# Patient Record
Sex: Male | Born: 1955 | Race: White | Hispanic: No | Marital: Married | Smoking: Former smoker
Health system: Southern US, Community
[De-identification: ages and names within clinical notes are randomized; demographics above are authoritative.]

## PROBLEM LIST (undated history)

## (undated) DIAGNOSIS — Z973 Presence of spectacles and contact lenses: Secondary | ICD-10-CM

## (undated) DIAGNOSIS — C801 Malignant (primary) neoplasm, unspecified: Secondary | ICD-10-CM

## (undated) DIAGNOSIS — I10 Essential (primary) hypertension: Secondary | ICD-10-CM

## (undated) DIAGNOSIS — M1990 Unspecified osteoarthritis, unspecified site: Secondary | ICD-10-CM

## (undated) DIAGNOSIS — E119 Type 2 diabetes mellitus without complications: Secondary | ICD-10-CM

## (undated) DIAGNOSIS — G4730 Sleep apnea, unspecified: Secondary | ICD-10-CM

## (undated) HISTORY — DX: Malignant (primary) neoplasm, unspecified: C80.1

## (undated) HISTORY — PX: VASECTOMY: SHX75

## (undated) HISTORY — PX: ELBOW SURGERY: SHX618

---

## 2008-02-08 ENCOUNTER — Emergency Department: Payer: Self-pay | Admitting: Emergency Medicine

## 2012-07-29 ENCOUNTER — Emergency Department: Payer: Self-pay | Admitting: Emergency Medicine

## 2012-08-08 ENCOUNTER — Emergency Department: Payer: Self-pay | Admitting: Emergency Medicine

## 2017-03-03 ENCOUNTER — Other Ambulatory Visit: Payer: Self-pay | Admitting: Sports Medicine

## 2017-03-03 DIAGNOSIS — M1712 Unilateral primary osteoarthritis, left knee: Secondary | ICD-10-CM

## 2017-05-02 ENCOUNTER — Encounter: Payer: Self-pay | Admitting: *Deleted

## 2017-05-02 ENCOUNTER — Other Ambulatory Visit: Payer: Self-pay

## 2017-05-09 ENCOUNTER — Encounter
Admission: RE | Disposition: A | Discharge status: Home / Self Care | Payer: Self-pay | Source: Ambulatory Visit | Attending: Orthopedic Surgery

## 2017-05-09 ENCOUNTER — Ambulatory Visit
Admission: RE | Admit: 2017-05-09 | Discharge: 2017-05-09 | Disposition: A | Discharge status: Home / Self Care | Payer: 59 | Source: Ambulatory Visit | Attending: Orthopedic Surgery | Admitting: Orthopedic Surgery

## 2017-05-09 ENCOUNTER — Ambulatory Visit: Payer: 59 | Admitting: Anesthesiology

## 2017-05-09 DIAGNOSIS — S83232A Complex tear of medial meniscus, current injury, left knee, initial encounter: Secondary | ICD-10-CM | POA: Insufficient documentation

## 2017-05-09 DIAGNOSIS — Z79899 Other long term (current) drug therapy: Secondary | ICD-10-CM | POA: Diagnosis not present

## 2017-05-09 DIAGNOSIS — Z7984 Long term (current) use of oral hypoglycemic drugs: Secondary | ICD-10-CM | POA: Diagnosis not present

## 2017-05-09 DIAGNOSIS — E785 Hyperlipidemia, unspecified: Secondary | ICD-10-CM | POA: Diagnosis not present

## 2017-05-09 DIAGNOSIS — E119 Type 2 diabetes mellitus without complications: Secondary | ICD-10-CM | POA: Insufficient documentation

## 2017-05-09 DIAGNOSIS — X58XXXA Exposure to other specified factors, initial encounter: Secondary | ICD-10-CM | POA: Diagnosis not present

## 2017-05-09 DIAGNOSIS — M25562 Pain in left knee: Secondary | ICD-10-CM | POA: Diagnosis present

## 2017-05-09 DIAGNOSIS — I10 Essential (primary) hypertension: Secondary | ICD-10-CM | POA: Insufficient documentation

## 2017-05-09 DIAGNOSIS — Z87891 Personal history of nicotine dependence: Secondary | ICD-10-CM | POA: Diagnosis not present

## 2017-05-09 DIAGNOSIS — Z7982 Long term (current) use of aspirin: Secondary | ICD-10-CM | POA: Diagnosis not present

## 2017-05-09 DIAGNOSIS — G4730 Sleep apnea, unspecified: Secondary | ICD-10-CM | POA: Insufficient documentation

## 2017-05-09 DIAGNOSIS — M25862 Other specified joint disorders, left knee: Secondary | ICD-10-CM | POA: Insufficient documentation

## 2017-05-09 HISTORY — PX: KNEE ARTHROSCOPY WITH MEDIAL MENISECTOMY: SHX5651

## 2017-05-09 HISTORY — DX: Unspecified osteoarthritis, unspecified site: M19.90

## 2017-05-09 HISTORY — DX: Sleep apnea, unspecified: G47.30

## 2017-05-09 HISTORY — DX: Presence of spectacles and contact lenses: Z97.3

## 2017-05-09 HISTORY — DX: Type 2 diabetes mellitus without complications: E11.9

## 2017-05-09 LAB — GLUCOSE, CAPILLARY
Glucose-Capillary: 182 mg/dL — ABNORMAL HIGH (ref 65–99)
Glucose-Capillary: 188 mg/dL — ABNORMAL HIGH (ref 65–99)

## 2017-05-09 SURGERY — ARTHROSCOPY, KNEE, WITH MEDIAL MENISCECTOMY
Anesthesia: General | Laterality: Left | Wound class: Clean

## 2017-05-09 MED ORDER — KETOROLAC TROMETHAMINE 30 MG/ML IJ SOLN
INTRAMUSCULAR | Status: DC | PRN
  Administered 2017-05-09: 30 mg via INTRAVENOUS

## 2017-05-09 MED ORDER — PROPOFOL 10 MG/ML IV BOLUS
INTRAVENOUS | Status: DC | PRN
  Administered 2017-05-09: 200 mg via INTRAVENOUS

## 2017-05-09 MED ORDER — LACTATED RINGERS IV SOLN: INTRAVENOUS | Status: DC

## 2017-05-09 MED ORDER — GLYCOPYRROLATE 0.2 MG/ML IJ SOLN
INTRAMUSCULAR | Status: DC | PRN
  Administered 2017-05-09: 0.1 mg via INTRAVENOUS

## 2017-05-09 MED ORDER — ASPIRIN EC 325 MG PO TBEC: 325.0000 mg | DELAYED_RELEASE_TABLET | Freq: Every day | ORAL | 0 refills | Status: AC

## 2017-05-09 MED ORDER — HYDROMORPHONE HCL 1 MG/ML IJ SOLN: 0.2500 mg | INTRAMUSCULAR | Status: DC | PRN

## 2017-05-09 MED ORDER — HYDROCODONE-ACETAMINOPHEN 5-325 MG PO TABS: 1.0000 | ORAL_TABLET | ORAL | 0 refills | Status: DC | PRN

## 2017-05-09 MED ORDER — OXYCODONE HCL 5 MG/5ML PO SOLN: 5.0000 mg | Freq: Once | ORAL | Status: DC | PRN

## 2017-05-09 MED ORDER — ONDANSETRON 4 MG PO TBDP: 4.0000 mg | ORAL_TABLET | Freq: Three times a day (TID) | ORAL | 0 refills | Status: DC | PRN

## 2017-05-09 MED ORDER — SODIUM CHLORIDE 0.9 % IV SOLN
900.0000 mg | Freq: Once | INTRAVENOUS | Status: AC
  Administered 2017-05-09: 900 mg via INTRAVENOUS

## 2017-05-09 MED ORDER — FENTANYL CITRATE (PF) 100 MCG/2ML IJ SOLN
INTRAMUSCULAR | Status: DC | PRN
  Administered 2017-05-09: 100 ug via INTRAVENOUS

## 2017-05-09 MED ORDER — OXYCODONE HCL 5 MG PO TABS: 5.0000 mg | ORAL_TABLET | Freq: Once | ORAL | Status: DC | PRN

## 2017-05-09 MED ORDER — LIDOCAINE HCL (CARDIAC) 20 MG/ML IV SOLN
INTRAVENOUS | Status: DC | PRN
  Administered 2017-05-09: 80 mg via INTRATRACHEAL

## 2017-05-09 MED ORDER — ONDANSETRON HCL 4 MG/2ML IJ SOLN
INTRAMUSCULAR | Status: DC | PRN
  Administered 2017-05-09: 4 mg via INTRAVENOUS

## 2017-05-09 MED ORDER — LIDOCAINE-EPINEPHRINE 1 %-1:100000 IJ SOLN
INTRAMUSCULAR | Status: DC | PRN
  Administered 2017-05-09: 30 mL

## 2017-05-09 MED ORDER — LACTATED RINGERS IV SOLN
INTRAVENOUS | Status: DC
  Administered 2017-05-09 (×2): via INTRAVENOUS

## 2017-05-09 MED ORDER — MIDAZOLAM HCL 5 MG/5ML IJ SOLN
INTRAMUSCULAR | Status: DC | PRN
  Administered 2017-05-09: 2 mg via INTRAVENOUS

## 2017-05-09 MED ORDER — IBUPROFEN 800 MG PO TABS: 800.0000 mg | ORAL_TABLET | Freq: Three times a day (TID) | ORAL | 0 refills | Status: AC

## 2017-05-09 MED ORDER — BUPIVACAINE HCL 0.5 % IJ SOLN
INTRAMUSCULAR | Status: DC | PRN
  Administered 2017-05-09: 30 mL

## 2017-05-09 SURGICAL SUPPLY — 43 items
ADAPTER IRRIG TUBE 2 SPIKE SOL (ADAPTER) ×6 IMPLANT
BLADE SURG 15 STRL LF DISP TIS (BLADE) ×1 IMPLANT
BLADE SURG 15 STRL SS (BLADE) ×2
BLADE SURG SZ11 CARB STEEL (BLADE) ×3 IMPLANT
BNDG COHESIVE 4X5 TAN STRL (GAUZE/BANDAGES/DRESSINGS) ×3 IMPLANT
BNDG ESMARK 6X12 TAN STRL LF (GAUZE/BANDAGES/DRESSINGS) IMPLANT
BRACE KNEE POST OP SHORT (BRACE) IMPLANT
BUR RADIUS 3.5 (BURR) ×3 IMPLANT
BUR RADIUS 4.0X18.5 (BURR) IMPLANT
CHLORAPREP W/TINT 26ML (MISCELLANEOUS) ×3 IMPLANT
CLEANER CAUTERY TIP 5X5 PAD (MISCELLANEOUS) ×1 IMPLANT
COOLER POLAR GLACIER W/PUMP (MISCELLANEOUS) ×3 IMPLANT
COVER LIGHT HANDLE UNIVERSAL (MISCELLANEOUS) ×6 IMPLANT
CUFF TOURN SGL QUICK 24 (TOURNIQUET CUFF) ×2
CUFF TOURN SGL QUICK 30 (MISCELLANEOUS)
CUFF TOURN SGL QUICK 34 (TOURNIQUET CUFF)
CUFF TRNQT CYL 24X4X40X1 (TOURNIQUET CUFF) ×1 IMPLANT
CUFF TRNQT CYL 34X4X40X1 (TOURNIQUET CUFF) IMPLANT
CUFF TRNQT CYL LO 30X4X (MISCELLANEOUS) IMPLANT
DRAPE IMP U-DRAPE 54X76 (DRAPES) ×3 IMPLANT
GAUZE SPONGE 4X4 12PLY STRL (GAUZE/BANDAGES/DRESSINGS) ×3 IMPLANT
GLOVE BIO SURGEON STRL SZ7.5 (GLOVE) ×3 IMPLANT
GLOVE BIOGEL PI IND STRL 8 (GLOVE) ×1 IMPLANT
GLOVE BIOGEL PI INDICATOR 8 (GLOVE) ×2
GOWN STRL REUS W/ TWL LRG LVL3 (GOWN DISPOSABLE) ×1 IMPLANT
GOWN STRL REUS W/TWL LRG LVL3 (GOWN DISPOSABLE) ×5 IMPLANT
IV LACTATED RINGER IRRG 3000ML (IV SOLUTION) ×8
IV LR IRRIG 3000ML ARTHROMATIC (IV SOLUTION) ×4 IMPLANT
KIT TURNOVER KIT A (KITS) ×3 IMPLANT
MAT BLUE FLOOR 46X72 FLO (MISCELLANEOUS) ×3 IMPLANT
NEPTUNE MANIFOLD (MISCELLANEOUS) ×3 IMPLANT
PACK ARTHROSCOPY KNEE (MISCELLANEOUS) ×3 IMPLANT
PAD CLEANER CAUTERY TIP 5X5 (MISCELLANEOUS) ×2
PAD WRAPON POLAR KNEE (MISCELLANEOUS) ×2 IMPLANT
PENCIL ELECTRO HAND CTR (MISCELLANEOUS) IMPLANT
SET TUBE SUCT SHAVER OUTFL 24K (TUBING) ×3 IMPLANT
SET TUBE TIP INTRA-ARTICULAR (MISCELLANEOUS) ×3 IMPLANT
SUT ETHILON 3-0 KS 30 BLK (SUTURE) ×3 IMPLANT
TOWEL OR 17X26 4PK STRL BLUE (TOWEL DISPOSABLE) ×6 IMPLANT
TUBING ARTHRO INFLOW-ONLY STRL (TUBING) ×3 IMPLANT
WAND HAND CNTRL MULTIVAC 50 (MISCELLANEOUS) IMPLANT
WAND HAND CNTRL MULTIVAC 90 (MISCELLANEOUS) ×3 IMPLANT
WRAPON POLAR PAD KNEE (MISCELLANEOUS) ×6

## 2017-05-09 NOTE — Anesthesia Preprocedure Evaluation (Signed)
Anesthesia Evaluation   Patient awake    Reviewed: Allergy & Precautions, H&P , NPO status , Patient's Chart, lab work & pertinent test results  History of Anesthesia Complications Negative for: history of anesthetic complications  Airway Mallampati: I  TM Distance: >3 FB Neck ROM: full    Dental no notable dental hx.    Pulmonary sleep apnea , former smoker,    Pulmonary exam normal        Cardiovascular Normal cardiovascular exam     Neuro/Psych    GI/Hepatic negative GI ROS, Neg liver ROS,   Endo/Other  diabetes, Well Controlled, Type 2  Renal/GU negative Renal ROS     Musculoskeletal   Abdominal   Peds  Hematology negative hematology ROS (+)   Anesthesia Other Findings   Reproductive/Obstetrics                             Anesthesia Physical Anesthesia Plan  ASA: II  Anesthesia Plan: General LMA   Post-op Pain Management:    Induction:   PONV Risk Score and Plan:   Airway Management Planned:   Additional Equipment:   Intra-op Plan:   Post-operative Plan:   Informed Consent: I have reviewed the patients History and Physical, chart, labs and discussed the procedure including the risks, benefits and alternatives for the proposed anesthesia with the patient or authorized representative who has indicated his/her understanding and acceptance.     Plan Discussed with:   Anesthesia Plan Comments:         Anesthesia Quick Evaluation

## 2017-05-09 NOTE — Transfer of Care (Signed)
Immediate Anesthesia Transfer of Care Note  Patient: Carlos Hammond  Procedure(s) Performed: KNEE ARTHROSCOPY WITH PARTIAL  MEDIAL MENISECTOMY CHONDROPLASTY (Left )  Patient Location: PACU  Anesthesia Type: General LMA  Level of Consciousness: awake, alert  and patient cooperative  Airway and Oxygen Therapy: Patient Spontanous Breathing and Patient connected to supplemental oxygen  Post-op Assessment: Post-op Vital signs reviewed, Patient's Cardiovascular Status Stable, Respiratory Function Stable, Patent Airway and No signs of Nausea or vomiting  Post-op Vital Signs: Reviewed and stable  Complications: No apparent anesthesia complications

## 2017-05-09 NOTE — Op Note (Signed)
Operative Note   SURGERY DATE: 05/09/2017  PRE-OP DIAGNOSIS:  1. Left medial meniscus tear 2. Left trochlea chondral defect  POST-OP DIAGNOSIS: 1. 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 Left knee arthroscopy, partial medial meniscectomy 2. Left chondroplasty of patellofemoral compartment  SURGEON: Cato Mulligan, MD  ANESTHESIA: Gen  ESTIMATED BLOOD LOSS:minimal  TOTAL IV FLUIDS: per anesthesia  INDICATION(S):  Carlos Hammond is a 62 y.o. male with signs and symptoms as well as MRI finding of medial meniscus tear. Chondral defect of the trochlea was also noted, but he did not appear to be symptomatic from this lesion. After discussion of risks, benefits, and alternatives to surgery, the patient elected to proceed.  OPERATIVE FINDINGS:   Examination under anesthesia:A careful examination under anesthesia was performed. Passive range of motion was: Hyperextension: 2. Extension: 0. Flexion: 140. Lachman: normal. Pivot Shift: normal. Posterior drawer: normal. Varus stability in full extension: normal. Varus stability in 30 degrees of flexion: normal. Valgus stability in full extension: normal. Valgus stability in 30 degrees of flexion: normal.  Intra-operative findings:A thorough arthroscopic examination of the knee was performed. The findings are: 1. Suprapatellar pouch: Normal 2. Undersurface of median ridge: Normal 3. Medial patellar facet: Grade 1 degenerative changes 4. Lateral patellar facet: Grade 2 degenerative changes 5. Trochlea: Grade 1 degenerative changes diffusely, with focal area of Grade 4 changes on lateral trochlea measuring ~76mm x 29mm 6. Lateral gutter/popliteus tendon: Normal 7. Hoffa's fat pad: Normal 8. Medial gutter/plica: Normal 9. ACL: Normal 10. PCL: Normal 11. Medial meniscus: Complex tear with primarily radial tear, but also parrot beak component of  tear 12. Medial compartment cartilage: Grade 1 degenerative changes 13. Lateral meniscus: Normal 14. Lateral compartment cartilage: Grade 1 degenerative changes  OPERATIVE REPORT:   I identified Carlos Hammond the pre-operative holding area. I marked theoperativeknee with my initials. I reviewed the risks and benefits of the proposed surgical intervention and the patient (and/or patient's guardian) wished to proceed. The patient was transferred to the operative suite and placed in the supine position with all bony prominences padded. Anesthesia was administered. Appropriate IV antibioticswere administered prior to incision. The extremity was then prepped and draped in standard fashion. A time out was performed confirming the correct extremity, correct patient, and correct procedure.  Arthroscopy portals were marked. Local anesthetic was injected to the planned portal sites. The anterolateral portalwasestablished with an 11blade.   The arthroscope was placed in the anterolateral portal and theninto the suprapatellar pouch. A diagnostic knee scope was completed with the above findings. The medial meniscus tear was identified.  Next the medial portal was established under needle localization. The MCL was pie-crusted to improve visualization of the posterior horn. The meniscal tear was debrided using an arthroscopic biter and an oscillating shaver until the meniscus had stable borders. A chondroplasty was performed of the patellofemoral compartment (lateral patellar facet and trochlear lesion) such that there were stable cartilage edges without any loose fragments of cartilage.  Arthroscopic fluid was removed from the joint.  The portals were closed with 3-0 Nylon suture. Sterile dressings included Xeroform, 4x4s, Sof-Rol, and Bias wrap. A Polarcare was placed.  The patient was then awakened and taken to the PACU hemodynamically stable without complication.   POSTOPERATIVE  PLAN: The patient will be discharged home today once they meet PACU criteria. Aspirin 325 mg daily was prescribed for 2 weeks for DVT prophylaxis. Physical therapy will start  on POD#3-4.Weight-bearing as tolerated. They will follow up in 2 weeks per protocol.

## 2017-05-09 NOTE — OR Nursing (Signed)
Patient was shaved by electric razor for anes. 3 lead placement. Per Jetty Peeks CRNA

## 2017-05-09 NOTE — Anesthesia Procedure Notes (Signed)
Procedure Name: LMA Insertion Performed by: Lind Guest, CRNA Pre-anesthesia Checklist: Patient identified, Patient being monitored, Timeout performed, Emergency Drugs available and Suction available Patient Re-evaluated:Patient Re-evaluated prior to induction Oxygen Delivery Method: Circle system utilized Preoxygenation: Pre-oxygenation with 100% oxygen Induction Type: IV induction Ventilation: Mask ventilation without difficulty LMA: LMA inserted LMA Size: 4.0 Tube type: Oral Number of attempts: 1 Placement Confirmation: positive ETCO2 and breath sounds checked- equal and bilateral Tube secured with: Tape Dental Injury: Teeth and Oropharynx as per pre-operative assessment

## 2017-05-09 NOTE — Discharge Instructions (Signed)
Arthroscopic Knee Surgery - Partial Meniscectomy  Post-Op Instructions  1. Bracing or crutches: Crutches will be provided at the time of discharge from the surgery center if you do not already have them.  2. Ice: You may be provided with a device [facility]) that allows you to ice the affected area effectively. Otherwise you can ice manually.   3. Driving:  Plan on not driving for at least two weeks. Please note that you are advised NOT to drive while taking narcotic pain medications as you may be impaired and unsafe to drive.  4. Activity: Ankle pumps several times an hour while awake to prevent blood clots. Weight bearing: as tolerated. Use crutches for as needed (usually ~1 week or less) until pain allows you to ambulate without a limp. Bending and straightening the knee is unlimited. Elevate knee above heart level as much as possible for one week. Avoid standing more than 5 minutes (consecutively) for the first week.  Avoid long distance travel for 2 weeks.  5. Medications:  - You have been provided a prescription for narcotic pain medicine. After surgery, take 1-2 narcotic tablets every 4 hours if needed for severe pain.  - A prescription for anti-nausea medication will be provided in case the narcotic medicine causes nausea - take 1 tablet every 6 hours only if nauseated.  - Take ibuprofen 800 mg every 8 hours WITH food to reduce post-operative knee swelling. DO NOT STOP IBUPROFEN POST-OP UNTIL INSTRUCTED TO DO SO at first post-op office visit (10-14 days after surgery). However, please discontinue if you have any abdominal discomfort after taking this.  - Take enteric coated aspirin 325 mg once daily for 2 weeks to prevent blood clots.    6. Bandages: The physical therapist should change the bandages at the first post-op appointment. If needed, the dressing supplies have been provided to you.  7. Physical Therapy: 1-2 times per week for 6 weeks. Therapy typically starts on post  operative Day 3 or 4. You have been provided an order for physical therapy. The therapist will provide home exercises.  8. Work: May return to full work usually around 2 weeks after 1st post-operative visit. May do light duty/desk job in approximately 1 week when off of narcotics, pain is well-controlled, and swelling has decreased. Labor intensive jobs may require 4-6 weeks to return.    9. Post-Op Appointments: Your first post-op appointment will be with Dr. Posey Pronto in approximately 2 weeks time.   If you find that they have not been scheduled please call the Orthopaedic Appointment front desk at [phone].      General Anesthesia, Adult, Care After These instructions provide you with information about caring for yourself after your procedure. Your health care provider may also give you more specific instructions. Your treatment has been planned according to current medical practices, but problems sometimes occur. Call your health care provider if you have any problems or questions after your procedure. What can I expect after the procedure? After the procedure, it is common to have:  Vomiting.  A sore throat.  Mental slowness.  It is common to feel:  Nauseous.  Cold or shivery.  Sleepy.  Tired.  Sore or achy, even in parts of your body where you did not have surgery.  Follow these instructions at home: For at least 24 hours after the procedure:  Do not: ? Participate in activities where you could fall or become injured. ? Drive. ? Use heavy machinery. ? Drink alcohol. ? Take  sleeping pills or medicines that cause drowsiness. ? Make important decisions or sign legal documents. ? Take care of children on your own.  Rest. Eating and drinking  If you vomit, drink water, juice, or soup when you can drink without vomiting.  Drink enough fluid to keep your urine clear or pale yellow.  Make sure you have little or no nausea before eating solid foods.  Follow  the diet recommended by your health care provider. General instructions  Have a responsible adult stay with you until you are awake and alert.  Return to your normal activities as told by your health care provider. Ask your health care provider what activities are safe for you.  Take over-the-counter and prescription medicines only as told by your health care provider.  If you smoke, do not smoke without supervision.  Keep all follow-up visits as told by your health care provider. This is important. Contact a health care provider if:  You continue to have nausea or vomiting at home, and medicines are not helpful.  You cannot drink fluids or start eating again.  You cannot urinate after 8-12 hours.  You develop a skin rash.  You have fever.  You have increasing redness at the site of your procedure. Get help right away if:  You have difficulty breathing.  You have chest pain.  You have unexpected bleeding.  You feel that you are having a life-threatening or urgent problem. This information is not intended to replace advice given to you by your health care provider. Make sure you discuss any questions you have with your health care provider. Document Released: 05/30/2000 Document Revised: 07/27/2015 Document Reviewed: 02/05/2015 Elsevier Interactive Patient Education  [employer].

## 2017-05-09 NOTE — H&P (Signed)
Paper H&P to be scanned into permanent record. H&P reviewed. No significant changes noted.  

## 2017-05-09 NOTE — Anesthesia Postprocedure Evaluation (Signed)
Anesthesia Post Note  Patient: Carlos Hammond  Procedure(s) Performed: KNEE ARTHROSCOPY WITH PARTIAL  MEDIAL MENISECTOMY CHONDROPLASTY (Left )  Patient location during evaluation: PACU Anesthesia Type: General Level of consciousness: awake and alert Pain management: pain level controlled Vital Signs Assessment: post-procedure vital signs reviewed and stable Respiratory status: spontaneous breathing Cardiovascular status: blood pressure returned to baseline Postop Assessment: no headache Anesthetic complications: no    Jaci Standard, III,  Zanyia Silbaugh D

## 2017-05-10 ENCOUNTER — Encounter: Payer: Self-pay | Admitting: Orthopedic Surgery

## 2019-01-07 ENCOUNTER — Other Ambulatory Visit (HOSPITAL_COMMUNITY): Payer: Self-pay | Admitting: Orthopedic Surgery

## 2019-01-07 ENCOUNTER — Other Ambulatory Visit: Payer: Self-pay | Admitting: Orthopedic Surgery

## 2019-01-07 DIAGNOSIS — M7542 Impingement syndrome of left shoulder: Secondary | ICD-10-CM

## 2019-01-14 ENCOUNTER — Ambulatory Visit
Admission: RE | Admit: 2019-01-14 | Discharge: 2019-01-14 | Disposition: A | Discharge status: Home / Self Care | Payer: 59 | Source: Ambulatory Visit | Attending: Orthopedic Surgery | Admitting: Orthopedic Surgery

## 2019-01-14 DIAGNOSIS — M7542 Impingement syndrome of left shoulder: Secondary | ICD-10-CM | POA: Diagnosis not present

## 2019-01-23 ENCOUNTER — Other Ambulatory Visit: Payer: Self-pay | Admitting: Orthopedic Surgery

## 2019-01-23 DIAGNOSIS — M7542 Impingement syndrome of left shoulder: Secondary | ICD-10-CM

## 2019-01-25 ENCOUNTER — Other Ambulatory Visit: Payer: Self-pay | Admitting: Orthopedic Surgery

## 2019-02-07 ENCOUNTER — Encounter: Payer: Self-pay | Admitting: *Deleted

## 2019-02-07 ENCOUNTER — Other Ambulatory Visit: Payer: Self-pay

## 2019-02-12 ENCOUNTER — Other Ambulatory Visit: Admission: RE | Admit: 2019-02-12 | Payer: 59 | Source: Ambulatory Visit

## 2019-02-13 ENCOUNTER — Other Ambulatory Visit
Admission: RE | Admit: 2019-02-13 | Discharge: 2019-02-13 | Disposition: A | Discharge status: Home / Self Care | Payer: 59 | Source: Ambulatory Visit | Attending: Rheumatology | Admitting: Rheumatology

## 2019-02-13 ENCOUNTER — Other Ambulatory Visit: Payer: Self-pay

## 2019-02-13 DIAGNOSIS — Z20828 Contact with and (suspected) exposure to other viral communicable diseases: Secondary | ICD-10-CM | POA: Diagnosis not present

## 2019-02-13 DIAGNOSIS — Z01812 Encounter for preprocedural laboratory examination: Secondary | ICD-10-CM | POA: Insufficient documentation

## 2019-02-13 LAB — SARS CORONAVIRUS 2 (TAT 6-24 HRS): SARS Coronavirus 2: NEGATIVE

## 2019-02-15 ENCOUNTER — Encounter
Admission: RE | Disposition: A | Discharge status: Home / Self Care | Payer: Self-pay | Source: Home / Self Care | Attending: Orthopedic Surgery

## 2019-02-15 ENCOUNTER — Encounter: Payer: Self-pay | Admitting: Orthopedic Surgery

## 2019-02-15 ENCOUNTER — Other Ambulatory Visit: Payer: Self-pay

## 2019-02-15 ENCOUNTER — Ambulatory Visit: Payer: No Typology Code available for payment source | Admitting: Anesthesiology

## 2019-02-15 ENCOUNTER — Ambulatory Visit
Admission: RE | Admit: 2019-02-15 | Discharge: 2019-02-15 | Disposition: A | Discharge status: Home / Self Care | Payer: No Typology Code available for payment source | Attending: Orthopedic Surgery | Admitting: Orthopedic Surgery

## 2019-02-15 DIAGNOSIS — M75112 Incomplete rotator cuff tear or rupture of left shoulder, not specified as traumatic: Secondary | ICD-10-CM | POA: Diagnosis not present

## 2019-02-15 DIAGNOSIS — I10 Essential (primary) hypertension: Secondary | ICD-10-CM | POA: Diagnosis not present

## 2019-02-15 DIAGNOSIS — E785 Hyperlipidemia, unspecified: Secondary | ICD-10-CM | POA: Insufficient documentation

## 2019-02-15 DIAGNOSIS — Z79899 Other long term (current) drug therapy: Secondary | ICD-10-CM | POA: Insufficient documentation

## 2019-02-15 DIAGNOSIS — G4730 Sleep apnea, unspecified: Secondary | ICD-10-CM | POA: Diagnosis not present

## 2019-02-15 DIAGNOSIS — Z8249 Family history of ischemic heart disease and other diseases of the circulatory system: Secondary | ICD-10-CM | POA: Diagnosis not present

## 2019-02-15 DIAGNOSIS — Z7984 Long term (current) use of oral hypoglycemic drugs: Secondary | ICD-10-CM | POA: Diagnosis not present

## 2019-02-15 DIAGNOSIS — Z820 Family history of epilepsy and other diseases of the nervous system: Secondary | ICD-10-CM | POA: Diagnosis not present

## 2019-02-15 DIAGNOSIS — M7542 Impingement syndrome of left shoulder: Secondary | ICD-10-CM | POA: Diagnosis not present

## 2019-02-15 DIAGNOSIS — Z880 Allergy status to penicillin: Secondary | ICD-10-CM | POA: Diagnosis not present

## 2019-02-15 DIAGNOSIS — Z83511 Family history of glaucoma: Secondary | ICD-10-CM | POA: Diagnosis not present

## 2019-02-15 DIAGNOSIS — Z833 Family history of diabetes mellitus: Secondary | ICD-10-CM | POA: Insufficient documentation

## 2019-02-15 DIAGNOSIS — E119 Type 2 diabetes mellitus without complications: Secondary | ICD-10-CM | POA: Diagnosis not present

## 2019-02-15 DIAGNOSIS — M7522 Bicipital tendinitis, left shoulder: Secondary | ICD-10-CM | POA: Diagnosis not present

## 2019-02-15 DIAGNOSIS — Z87891 Personal history of nicotine dependence: Secondary | ICD-10-CM | POA: Diagnosis not present

## 2019-02-15 DIAGNOSIS — Z7982 Long term (current) use of aspirin: Secondary | ICD-10-CM | POA: Diagnosis not present

## 2019-02-15 HISTORY — PX: SHOULDER ARTHROSCOPY WITH SUBACROMIAL DECOMPRESSION AND BICEP TENDON REPAIR: SHX5689

## 2019-02-15 LAB — GLUCOSE, CAPILLARY
Glucose-Capillary: 169 mg/dL — ABNORMAL HIGH (ref 70–99)
Glucose-Capillary: 144 mg/dL — ABNORMAL HIGH (ref 70–99)

## 2019-02-15 SURGERY — SHOULDER ARTHROSCOPY WITH SUBACROMIAL DECOMPRESSION AND BICEP TENDON REPAIR
Anesthesia: General | Site: Shoulder | Laterality: Left

## 2019-02-15 MED ORDER — DEXAMETHASONE SODIUM PHOSPHATE 4 MG/ML IJ SOLN
INTRAMUSCULAR | Status: DC | PRN
  Administered 2019-02-15: 4 mg via INTRAVENOUS

## 2019-02-15 MED ORDER — ACETAMINOPHEN 160 MG/5ML PO SOLN: 325.0000 mg | ORAL | Status: DC | PRN

## 2019-02-15 MED ORDER — BUPIVACAINE HCL (PF) 0.5 % IJ SOLN
INTRAMUSCULAR | Status: DC | PRN
  Administered 2019-02-15: 20 mL via PERINEURAL

## 2019-02-15 MED ORDER — LACTATED RINGERS IV SOLN
INTRAVENOUS | Status: DC | PRN
  Administered 2019-02-15: 4 mL

## 2019-02-15 MED ORDER — ONDANSETRON HCL 4 MG/2ML IJ SOLN
INTRAMUSCULAR | Status: DC | PRN
  Administered 2019-02-15: 4 mg via INTRAVENOUS

## 2019-02-15 MED ORDER — ACETAMINOPHEN 325 MG PO TABS: 325.0000 mg | ORAL_TABLET | ORAL | Status: DC | PRN

## 2019-02-15 MED ORDER — FENTANYL CITRATE (PF) 100 MCG/2ML IJ SOLN
INTRAMUSCULAR | Status: DC | PRN
  Administered 2019-02-15: 50 ug via INTRAVENOUS

## 2019-02-15 MED ORDER — CLINDAMYCIN PHOSPHATE 900 MG/50ML IV SOLN
900.0000 mg | INTRAVENOUS | Status: AC
  Administered 2019-02-15: 900 mg via INTRAVENOUS

## 2019-02-15 MED ORDER — LIDOCAINE HCL (CARDIAC) PF 100 MG/5ML IV SOSY
PREFILLED_SYRINGE | INTRAVENOUS | Status: DC | PRN
  Administered 2019-02-15: 30 mg via INTRATRACHEAL

## 2019-02-15 MED ORDER — OXYCODONE HCL 5 MG/5ML PO SOLN: 5.0000 mg | Freq: Once | ORAL | Status: DC | PRN

## 2019-02-15 MED ORDER — OXYCODONE HCL 5 MG PO TABS: 5.0000 mg | ORAL_TABLET | Freq: Once | ORAL | Status: DC | PRN

## 2019-02-15 MED ORDER — ASPIRIN EC 325 MG PO TBEC: 325.0000 mg | DELAYED_RELEASE_TABLET | Freq: Every day | ORAL | 0 refills | Status: AC

## 2019-02-15 MED ORDER — ONDANSETRON HCL 4 MG/2ML IJ SOLN: 4.0000 mg | Freq: Once | INTRAMUSCULAR | Status: DC | PRN

## 2019-02-15 MED ORDER — ACETAMINOPHEN 500 MG PO TABS: 1000.0000 mg | ORAL_TABLET | Freq: Three times a day (TID) | ORAL | 2 refills | Status: AC

## 2019-02-15 MED ORDER — FENTANYL CITRATE (PF) 100 MCG/2ML IJ SOLN: 25.0000 ug | INTRAMUSCULAR | Status: DC | PRN

## 2019-02-15 MED ORDER — MIDAZOLAM HCL 5 MG/5ML IJ SOLN
INTRAMUSCULAR | Status: DC | PRN
  Administered 2019-02-15: 2 mg via INTRAVENOUS

## 2019-02-15 MED ORDER — GLYCOPYRROLATE 0.2 MG/ML IJ SOLN
INTRAMUSCULAR | Status: DC | PRN
  Administered 2019-02-15: .1 mg via INTRAVENOUS

## 2019-02-15 MED ORDER — LACTATED RINGERS IV SOLN
100.0000 mL/h | INTRAVENOUS | Status: DC
  Administered 2019-02-15: 11:00:00 via INTRAVENOUS

## 2019-02-15 MED ORDER — BUPIVACAINE LIPOSOME 1.3 % IJ SUSP
INTRAMUSCULAR | Status: DC | PRN
  Administered 2019-02-15: 20 mL via PERINEURAL

## 2019-02-15 MED ORDER — PROPOFOL 10 MG/ML IV BOLUS
INTRAVENOUS | Status: DC | PRN
  Administered 2019-02-15: 180 mg via INTRAVENOUS

## 2019-02-15 MED ORDER — CHLORHEXIDINE GLUCONATE 4 % EX LIQD: 60.0000 mL | Freq: Once | CUTANEOUS | Status: DC

## 2019-02-15 MED ORDER — OXYCODONE HCL 5 MG PO TABS: 5.0000 mg | ORAL_TABLET | ORAL | 0 refills | Status: AC | PRN

## 2019-02-15 MED ORDER — ONDANSETRON 4 MG PO TBDP: 4.0000 mg | ORAL_TABLET | Freq: Three times a day (TID) | ORAL | 0 refills | Status: DC | PRN

## 2019-02-15 SURGICAL SUPPLY — 74 items
ADAPTER IRRIG TUBE 2 SPIKE SOL (ADAPTER) ×6 IMPLANT
ANCHOR 3.9 PEEK 3 CORKSCREW (Anchor) ×3 IMPLANT
ANCHOR BONE REGENETEN (Anchor) ×3 IMPLANT
ANCHOR SUT FBRTK SUTURETAP 1.3 (Anchor) ×3 IMPLANT
ANCHOR TENDON REGENETEN (Staple) ×3 IMPLANT
BIT DRILL RIGD1.8MM FBRTK STRL (DRILL) ×1 IMPLANT
BLADE CLIPPER SPEC (BLADE) ×3 IMPLANT
BUR BR 5.5 12 FLUTE (BURR) ×3 IMPLANT
BUR RADIUS 4.0X18.5 (BURR) ×3 IMPLANT
CANNULA 5.75X7CM (CANNULA) ×1
CANNULA PART THRD DISP 5.75X7 (CANNULA) ×2 IMPLANT
CANNULA PARTIAL THREAD 2X7 (CANNULA) ×3 IMPLANT
CHLORAPREP W/TINT 26 (MISCELLANEOUS) ×3 IMPLANT
COOLER POLAR GLACIER W/PUMP (MISCELLANEOUS) ×3 IMPLANT
COVER LIGHT HANDLE UNIVERSAL (MISCELLANEOUS) ×6 IMPLANT
DERMABOND ADVANCED (GAUZE/BANDAGES/DRESSINGS) ×2
DERMABOND ADVANCED .7 DNX12 (GAUZE/BANDAGES/DRESSINGS) ×1 IMPLANT
DRAPE IMP U-DRAPE 54X76 (DRAPES) ×3 IMPLANT
DRAPE INCISE IOBAN 66X45 STRL (DRAPES) ×3 IMPLANT
DRAPE SHEET LG 3/4 BI-LAMINATE (DRAPES) ×3 IMPLANT
DRAPE U-SHAPE 48X52 POLY STRL (PACKS) ×6 IMPLANT
DRILL RIGID 1.8MM FBRTK STRL (DRILL) ×3
DRSG TEGADERM 4X4.75 (GAUZE/BANDAGES/DRESSINGS) ×9 IMPLANT
ELECT REM PT RETURN 9FT ADLT (ELECTROSURGICAL) ×3
ELECTRODE REM PT RTRN 9FT ADLT (ELECTROSURGICAL) ×1 IMPLANT
FEE LOANER SHOULDER (INSTRUMENTS) ×1 IMPLANT
GAUZE XEROFORM 1X8 LF (GAUZE/BANDAGES/DRESSINGS) IMPLANT
GLOVE BIO SURGEON STRL SZ7.5 (GLOVE) ×6 IMPLANT
GLOVE BIOGEL PI IND STRL 8 (GLOVE) ×2 IMPLANT
GLOVE BIOGEL PI INDICATOR 8 (GLOVE) ×4
GOWN STRL REIN 2XL XLG LVL4 (GOWN DISPOSABLE) ×3 IMPLANT
GOWN STRL REUS W/ TWL LRG LVL3 (GOWN DISPOSABLE) ×2 IMPLANT
GOWN STRL REUS W/TWL LRG LVL3 (GOWN DISPOSABLE) ×4
IMPL REGENETEN MEDIUM (Shoulder) ×1 IMPLANT
IMPLANT REGENETEN MEDIUM (Shoulder) ×3 IMPLANT
IV LACTATED RINGER IRRG 3000ML (IV SOLUTION) ×16
IV LR IRRIG 3000ML ARTHROMATIC (IV SOLUTION) ×8 IMPLANT
KIT CORKSCREW KNTLS 3.9 S/T/P (INSTRUMENTS) ×3 IMPLANT
KIT SPEAR STR 1.6MM DRILL (MISCELLANEOUS) ×3 IMPLANT
KIT STABILIZATION SHOULDER (MISCELLANEOUS) ×3 IMPLANT
KIT TURNOVER KIT A (KITS) ×3 IMPLANT
MANIFOLD NEPTUNE II (INSTRUMENTS) ×3 IMPLANT
MASK FACE SPIDER DISP (MASK) ×3 IMPLANT
MAT GRAY ABSORB FLUID 28X50 (MISCELLANEOUS) ×9 IMPLANT
NDL MAYO CATGUT SZ5 (NEEDLE) ×2
NDL SAFETY ECLIPSE 18X1.5 (NEEDLE) IMPLANT
NDL SUT 5 .5 CRC TPR PNT MAYO (NEEDLE) ×1 IMPLANT
NEEDLE HYPO 18GX1.5 SHARP (NEEDLE)
NEEDLE SCORPION MULTI FIRE (NEEDLE) ×3 IMPLANT
PACK ARTHROSCOPY SHOULDER (MISCELLANEOUS) ×3 IMPLANT
PAD WRAPON POLAR SHDR UNIV (MISCELLANEOUS) ×1 IMPLANT
PENCIL SMOKE EVACUATOR (MISCELLANEOUS) ×3 IMPLANT
SET TUBE SUCT SHAVER OUTFL 24K (TUBING) ×3 IMPLANT
SET TUBE TIP INTRA-ARTICULAR (MISCELLANEOUS) ×3 IMPLANT
SHOULDER LOANER FEE (INSTRUMENTS) ×3
SPONGE GAUZE 2X2 8PLY STER LF (GAUZE/BANDAGES/DRESSINGS) ×3
SPONGE GAUZE 2X2 8PLY STRL LF (GAUZE/BANDAGES/DRESSINGS) ×6 IMPLANT
SPONGE LAP 18X18 RF (DISPOSABLE) ×3 IMPLANT
SUT ETHILON 3-0 FS-10 30 BLK (SUTURE) ×3
SUT MNCRL 4-0 (SUTURE) ×2
SUT MNCRL 4-0 27XMFL (SUTURE) ×1
SUT PROLENE 2 0 CT2 30 (SUTURE) ×3 IMPLANT
SUT VIC AB 0 CT1 36 (SUTURE) ×3 IMPLANT
SUT VIC AB 2-0 CT1 27 (SUTURE) ×2
SUT VIC AB 2-0 CT1 TAPERPNT 27 (SUTURE) ×1 IMPLANT
SUTURE EHLN 3-0 FS-10 30 BLK (SUTURE) ×1 IMPLANT
SUTURE MNCRL 4-0 27XMF (SUTURE) ×1 IMPLANT
SUTURE TAPE 1.3 40 TPR END (SUTURE) ×2 IMPLANT
SUTURETAPE 1.3 40 TPR END (SUTURE) ×6
SYR 10ML LL (SYRINGE) IMPLANT
TAPE MICROFOAM 4IN (TAPE) ×3 IMPLANT
TUBING ARTHRO INFLOW-ONLY STRL (TUBING) ×3 IMPLANT
WAND WEREWOLF FLOW 90D (MISCELLANEOUS) ×3 IMPLANT
WRAPON POLAR PAD SHDR UNIV (MISCELLANEOUS) ×3

## 2019-02-15 NOTE — Discharge Instructions (Signed)
Post-Op Instructions - Rotator Cuff Repair  1. Bracing: You will wear a shoulder immobilizer or sling for 4 weeks.   2. Driving: No driving for 2 weeks post-op. When driving, do not wear the immobilizer. Ideally, we recommend no driving for 4 weeks while sling is in place as one arm will be immobilized.   3. Activity: No active lifting for 6 weeks. Wrist, hand, and elbow motion only. Avoid lifting the upper arm away from the body except for hygiene. You are permitted to bend and straighten the elbow passively only (no active elbow motion). You may use your hand and wrist for typing, writing, and managing utensils (cutting food). Do not lift more than a coffee cup for 8 weeks.  When sleeping or resting, inclined positions (recliner chair or wedge pillow) and a pillow under the forearm for support may provide better comfort for up to 4 weeks.  Avoid long distance travel for 4 weeks.  Return to normal activities after rotator cuff repair repair normally takes 6 months on average. If rehab goes very well, may be able to do most activities at 4 months, except overhead or contact sports.  4. Physical Therapy: Begins 3-4 days after surgery, and proceed 1 time per week for the first 6 weeks, then 1-2 times per week from weeks 6-20 post-op.  5. Medications:  - You will be provided a prescription for narcotic pain medicine. After surgery, take 1-2 narcotic tablets every 4 hours if needed for severe pain.  - A prescription for anti-nausea medication will be provided in case the narcotic medicine causes nausea - take 1 tablet every 6 hours only if nauseated.   - Take tylenol 1000 mg (2 Extra Strength tablets or 3 regular strength) every 8 hours for pain.  May decrease or stop tylenol 5 days after surgery if you are having minimal pain. - Take ASA 325mg /day x 2 weeks to help prevent DVTs/PEs (blood clots).  - DO NOT take ANY nonsteroidal anti-inflammatory pain medications (Advil, Motrin, Ibuprofen, Aleve,  Naproxen, or Naprosyn). These medicines can inhibit healing of your shoulder repair.    If you are taking prescription medication for anxiety, depression, insomnia, muscle spasm, chronic pain, or for attention deficit disorder, you are advised that you are at a higher risk of adverse effects with use of narcotics post-op, including narcotic addiction/dependence, depressed breathing, death. If you use non-prescribed substances: alcohol, marijuana, cocaine, heroin, methamphetamines, etc., you are at a higher risk of adverse effects with use of narcotics post-op, including narcotic addiction/dependence, depressed breathing, death. You are advised that taking > 50 morphine milligram equivalents (MME) of narcotic pain medication per day results in twice the risk of overdose or death. For your prescription provided: oxycodone 5 mg - taking more than 6 tablets per day would result in > 50 morphine milligram equivalents (MME) of narcotic pain medication. Be advised that we will prescribe narcotics short-term, for acute post-operative pain only - 3 weeks for major operations such as shoulder repair/reconstruction surgeries.     6. Post-Op Appointment:  Your first post-op appointment will be 10-14 days post-op.  7. Work or School: For most, but not all procedures, we advise staying out of work or school for at least 1 to 2 weeks in order to recover from the stress of surgery and to allow time for healing.   If you need a work or school note this can be provided.   8. Smoking: If you are a smoker, you need to refrain from  smoking in the postoperative period. The nicotine in cigarettes will inhibit healing of your shoulder repair and decrease the chance of successful repair. Similarly, nicotine containing products (gum, patches) should be avoided.   Post-operative Brace: Apply and remove the brace you received as you were instructed to at the time of fitting and as described in detail as the braces  instructions for use indicate.  Wear the brace for the period of time prescribed by your physician.  The brace can be cleaned with soap and water and allowed to air dry only.  Should the brace result in increased pain, decreased feeling (numbness/tingling), increased swelling or an overall worsening of your medical condition, please contact your doctor immediately.  If an emergency situation occurs as a result of wearing the brace after normal business hours, please dial 911 and seek immediate medical attention.  Let your doctor know if you have any further questions about the brace issued to you. Refer to the shoulder sling instructions for use if you have any questions regarding the correct fit of your shoulder sling.  [employer]ooting: [phone]  Video that illustrates how to properly use a shoulder sling: "Instructions for Proper Use of an Orthopaedic Sling" [email or web address]       General Anesthesia, Adult, Care After This sheet gives you information about how to care for yourself after your procedure. Your health care provider may also give you more specific instructions. If you have problems or questions, contact your health care provider. What can I expect after the procedure? After the procedure, the following side effects are common:  Pain or discomfort at the IV site.  Nausea.  Vomiting.  Sore throat.  Trouble concentrating.  Feeling cold or chills.  Weak or tired.  Sleepiness and fatigue.  Soreness and body aches. These side effects can affect parts of the body that were not involved in surgery. Follow these instructions at home:  For at least 24 hours after the procedure:  Have a responsible adult stay with you. It is important to have someone help care for you until you are awake and alert.  Rest as needed.  Do not: ? Participate in activities in which you could fall or become injured. ? Drive. ? Use heavy  machinery. ? Drink alcohol. ? Take sleeping pills or medicines that cause drowsiness. ? Make important decisions or sign legal documents. ? Take care of children on your own. Eating and drinking  Follow any instructions from your health care provider about eating or drinking restrictions.  When you feel hungry, start by eating small amounts of foods that are soft and easy to digest (bland), such as toast. Gradually return to your regular diet.  Drink enough fluid to keep your urine pale yellow.  If you vomit, rehydrate by drinking water, juice, or clear broth. General instructions  If you have sleep apnea, surgery and certain medicines can increase your risk for breathing problems. Follow instructions from your health care provider about wearing your sleep device: ? Anytime you are sleeping, including during daytime naps. ? While taking prescription pain medicines, sleeping medicines, or medicines that make you drowsy.  Return to your normal activities as told by your health care provider. Ask your health care provider what activities are safe for you.  Take over-the-counter and prescription medicines only as told by your health care provider.  If you smoke, do not smoke without supervision.  Keep all follow-up visits as told by your health care provider. This is  important. Contact a health care provider if:  You have nausea or vomiting that does not get better with medicine.  You cannot eat or drink without vomiting.  You have pain that does not get better with medicine.  You are unable to pass urine.  You develop a skin rash.  You have a fever.  You have redness around your IV site that gets worse. Get help right away if:  You have difficulty breathing.  You have chest pain.  You have blood in your urine or stool, or you vomit blood. Summary  After the procedure, it is common to have a sore throat or nausea. It is also common to feel tired.  Have a responsible  adult stay with you for the first 24 hours after general anesthesia. It is important to have someone help care for you until you are awake and alert.  When you feel hungry, start by eating small amounts of foods that are soft and easy to digest (bland), such as toast. Gradually return to your regular diet.  Drink enough fluid to keep your urine pale yellow.  Return to your normal activities as told by your health care provider. Ask your health care provider what activities are safe for you. This information is not intended to replace advice given to you by your health care provider. Make sure you discuss any questions you have with your health care provider. Document Released: 05/30/2000 Document Revised: 02/24/2017 Document Reviewed: 10/07/2016 Elsevier Patient Education  2020 [employer].

## 2019-02-15 NOTE — H&P (Signed)
Paper H&P to be scanned into permanent record. H&P reviewed. No significant changes noted.  

## 2019-02-15 NOTE — Anesthesia Preprocedure Evaluation (Signed)
Anesthesia Evaluation  Patient identified by MRN, date of birth, ID band Patient awake    Reviewed: Allergy & Precautions, H&P , NPO status , Patient's Chart, lab work & pertinent test results  Airway Mallampati: II  TM Distance: >3 FB Neck ROM: full    Dental no notable dental hx.    Pulmonary sleep apnea , former smoker,    Pulmonary exam normal breath sounds clear to auscultation       Cardiovascular Normal cardiovascular exam Rhythm:regular Rate:Normal     Neuro/Psych    GI/Hepatic   Endo/Other  diabetes, Type 2  Renal/GU      Musculoskeletal   Abdominal   Peds  Hematology   Anesthesia Other Findings   Reproductive/Obstetrics                             Anesthesia Physical Anesthesia Plan  ASA: III  Anesthesia Plan: General LMA   Post-op Pain Management:  Regional for Post-op pain   Induction:   PONV Risk Score and Plan: 2 and Ondansetron, Dexamethasone, Treatment may vary due to age or medical condition and Midazolam  Airway Management Planned:   Additional Equipment:   Intra-op Plan:   Post-operative Plan:   Informed Consent: I have reviewed the patients History and Physical, chart, labs and discussed the procedure including the risks, benefits and alternatives for the proposed anesthesia with the patient or authorized representative who has indicated his/her understanding and acceptance.       Plan Discussed with: CRNA  Anesthesia Plan Comments:         Anesthesia Quick Evaluation

## 2019-02-15 NOTE — Anesthesia Postprocedure Evaluation (Signed)
Anesthesia Post Note  Patient: Carlos Hammond  Procedure(s) Performed: SHOULDER ARTHROSCOPY WITH SUBACROMIAL DECOMPRESSION AND BICEP TENDONODESISs,  Subscapularis Repair, [place] Patch Application (Left Shoulder)     Patient location during evaluation: PACU Anesthesia Type: General Level of consciousness: awake and alert and oriented Pain management: satisfactory to patient Vital Signs Assessment: post-procedure vital signs reviewed and stable Respiratory status: spontaneous breathing, nonlabored ventilation and respiratory function stable Cardiovascular status: blood pressure returned to baseline and stable Postop Assessment: Adequate PO intake and No signs of nausea or vomiting Anesthetic complications: no    Raliegh Ip

## 2019-02-15 NOTE — Op Note (Signed)
SURGERY DATE: 02/15/2019  PRE-OP DIAGNOSIS:  1. Left partial-thickness articular sided supraspinatus tear 2. Left subacromial impingement 3. Left biceps tendinopathy   POST-OP DIAGNOSIS: 1. Left rotator cuff tears (partial-thickness subscapularis, partial articular sided supraspinatus with small full-thickness component medially at the musculotendinous junction) 2. Left subacromial impingement 3. Left biceps tendinopathy   PROCEDURES:  1. Left arthroscopic rotator cuff repair (subscapularis) 2. Left mini-open rotator cuff repair (supraspinatus) with Regeneten patch augmentation 3. Left open biceps tenodesis 4. Left arthroscopic extensive debridement of shoulder (glenohumeral and subacromial spaces) 5. Left arthroscopic subacromial decompression  SURGEON: Cato Mulligan, MD  ASSISTANT: Anitra Lauth, PA  ANESTHESIA: Gen with Exparil interscalene block  ESTIMATED BLOOD LOSS: 25cc  DRAINS:  none  TOTAL IV FLUIDS: per anesthesia   SPECIMENS: none  IMPLANTS:  - Arthrex 3.39mm Knotless Corkscrew x1 - Arthrex FiberTak Suture Anchor - Double Loaded x1 - [employer] Medium Regeneten Patch x 1   OPERATIVE FINDINGS:  Examination under anesthesia: A careful examination under anesthesia was performed.  Passive range of motion was: FF: 150; ER at side: 50; ER in abduction: 90; IR in abduction: 50.  Anterior load shift: NT.  Posterior load shift: NT.  Sulcus in neutral: NT.  Sulcus in ER: NT.    Intra-operative findings: A thorough arthroscopic examination of the shoulder was performed.  The findings are: 1. Biceps tendon: significant tendinopathy 2. Superior labrum: injected with surrounding synovitis, Type II SLAP tear 3. Posterior labrum and capsule: normal 4. Inferior capsule and inferior recess: normal 5. Glenoid cartilage surface: Normal 6. Supraspinatus attachment: significant articular sided partial-thickness tearing of the anterior fibers involving ~40% of the footprint  with additional small area of full-thickness tearing of the mid-supraspinatus medial to the footprint at the musculotendinous junction 7. Posterior rotator cuff attachment: normal 8. Humeral head articular cartilage: normal 9. Rotator interval: significant synovitis 10: Subscapularis tendon: partial-thickness articular sided tear of the superior border 11. Anterior labrum: degenerative 12. IGHL: normal  OPERATIVE REPORT:   Indications for procedure: Carlos Hammond is a 63 y.o. male with ~8 months of L shoulder pain without specific traumatic event. Since that time, the patient has failed non-operative management including activity modification and medical management as well as corticosteroid injection without adequate relief of symptoms. Clinical exam and MRI were suggestive of subacromial impingement and biceps tendinopathy. Given these findings, we decided to proceed with surgical management. After discussion of risks, benefits, and alternatives to surgery, the patient elected to proceed.   Procedure in detail:  I identified Carlos Hammond in the pre-operative holding area.  I marked the operative shoulder with my initials. I reviewed the risks and benefits of the proposed surgical intervention, and the patient (and/or patient's guardian) wished to proceed.  Anesthesia was then performed with an Exparil interscalene block.  The patient was transferred to the operative suite and placed in the beach chair position.    SCDs were placed on the lower extremities. Appropriate IV antibiotics were administered prior to incision. The operative upper extremity was then prepped and draped in standard fashion. A time out was performed confirming the correct extremity, correct patient, and correct procedure.   I then created a standard posterior portal with an 11 blade. The glenohumeral joint was easily entered with a blunt trochar and the arthroscope introduced. The findings of diagnostic arthroscopy are  described above.  A standard anterior portal was made.  I debrided degenerative tissue including the synovitic tissue about the rotator interval and IGHL as  well as the anterior and superior labrum. I then coagulated the inflamed synovium to obtain hemostasis and reduce the risk of post-operative swelling using an Arthrocare radiofrequency device. The biceps tendon was cut at its insertion on the superior labrum with arthroscopic scissors.  An ArthroCare wand was used to debride the proximal biceps tendon stump  The subscapularis tear was identified.  A 7 mm cannula was placed.  The comma tissue indicating the superolateral border of the subscapularis was identified readily.  The tip of the coracoid as well as the conjoined tendon and coracoacromial ligaments were visualized after debriding rotator interval tissue.  Tissue about the subscapularis was released anteriorly, superiorly, and posteriorly to allow for improved mobilization.  The lesser tuberosity footprint was exposed, and it was prepared with electrocautery to clear it of all soft tissue.  An Arthrex knotless corkscrew was placed into the lesser tuberosity footprint from the anterior portal.  A Scorpion suture passing device was used to pass the passing suture through the torn superior border of the subscapularis.  The suture was shuttled through the anchor.  With the arm in neutral rotation, the repair was tensioned appropriately. This appropriately reduced the subscapularis tear.  The arm was then internally and externally rotated and the subscapularis was noted to move appropriately with rotation.  The remainder of the suture was then cut.  Next, a spinal needle was placed through the lateral aspect of the shoulder, and it was used to pass a 2-0 Prolene suture through the posterior most aspect of the articular sided supraspinatus tear.  The other and was retrieved from the anterior portal.  Next, the arthroscope was then introduced into the  subacromial space.  An extensive subacromial bursectomy was performed using a combination of the shaver and Arthrocare wand. The entire acromial undersurface was exposed and the CA ligament was subperiosteally elevated to expose the anterior acromial hook. A 5.81mm barrel burr was used to create a flat anterior and lateral aspect of the acromion, converting it from a Type 2 to a Type 1 acromion. Care was made to keep the deltoid fascia intact.  Arthroscopic fluid was removed from the joint as this concluded the arthroscopic portion of the case.  A longitudinal incision from the anterolateral acromion ~6cm in length was made overlying the raphe between the anterior and middle heads of the deltoid.  The raphe was identified and it was incised. The subacromial space was identified. Any remaining bursa was excised.   We then turned our attention to the biceps tenodesis. The arm was externally rotated.  The bicipital groove was identified.  A 15 blade was used to make a cut overlying the biceps tendon, and the tendon was removed using a right angle clamp.  The base of the bicipital groove was identified and cleared of soft tissue.  A FiberTak anchor was placed in the bicipital groove.  The biceps tendon was held at the appropriate amount of tension.  One set of sutures was passed through the biceps anchor with one limb passed in a simple fashion and the second limb passed in a simple plus locking stitch pattern.  This was repeated for the other set of sutures.  This construct allowed for shuttling the biceps tendon down to the bone.  The sutures were tied and cut.  The diseased portion of the proximal biceps was then excised.  The arm was then internally rotated.  There was a small area of supraspinatus medial to the footprint at the level of the  musculotendinous junction that had a very small component of a full-thickness tear, which was discovered on probing the tendon.  Two suture tapes were passed across this  longitudinal split and tied using a knot pusher.  This appropriately reduced the torn portion, and the probe was no longer able to enter into the joint.    The area of partial-thickness tearing spanned from just posterior to the bicipital groove to the region of the previously passed 2-0 Prolene suture.  There was no area of full-thickness tearing in this region. We decided to proceed with Regeneten patch placement. The Regeneten patch delivery gun was placed appropriately and the patch was delivered over the supraspinatus tendon. It was positioned such that all areas of partial-thickness rotator cuff tear were covered. Tendon staples were placed medially, anteriorly, and posteriorly. Two bone staples were then placed laterally. The patch was then probed to confirm appropriate stability.   The wound was thoroughly irrigated.  The deltoid split was closed with 0 Vicryl.  The subdermal layer was closed with 2-0 Vicryl.  The skin was closed with 4-0 Monocryl and Dermabond. The portals were closed with 3-0 Nylon. Xeroform was applied to the incisions. A sterile dressing was applied, followed by a Polar Care sleeve and a SlingShot shoulder immobilizer/sling. The patient was awakened from anesthesia without difficulty and was transferred to the PACU in stable condition.   Of note, assistance from a PA was essential to performing the surgery. PA assisted with patient positioning, retraction, and instrumentation. The surgery would have been more difficult and had longer operative time without PA assistance.    COMPLICATIONS: none  DISPOSITION: plan for discharge home after recovery in PACU   POSTOPERATIVE PLAN: Remain in sling (except hygiene and elbow/wrist/hand RoM exercises as instructed by PT) x 4 weeks and NWB for this time. PT to begin 3-4 days after surgery.  Use small/medium cuff repair rehab protocol with subscapularis repair and biceps tenodesis protocol.  ASA 325mg  daily x 2 weeks for DVT ppx.

## 2019-02-15 NOTE — Progress Notes (Signed)
Assisted Clance Boll ANMD with left, interscalene  block. Side rails up, monitors on throughout procedure. See vital signs in flow sheet. Tolerated Procedure well.

## 2019-02-15 NOTE — Transfer of Care (Signed)
Immediate Anesthesia Transfer of Care Note  Patient: Carlos Hammond  Procedure(s) Performed: SHOULDER ARTHROSCOPY WITH SUBACROMIAL DECOMPRESSION AND BICEP TENDONODESISs,  Subscapularis Repair, Regenten Patch Application (Left Shoulder)  Patient Location: PACU  Anesthesia Type: General LMA  Level of Consciousness: awake, alert  and patient cooperative  Airway and Oxygen Therapy: Patient Spontanous Breathing and Patient connected to supplemental oxygen  Post-op Assessment: Post-op Vital signs reviewed, Patient's Cardiovascular Status Stable, Respiratory Function Stable, Patent Airway and No signs of Nausea or vomiting  Post-op Vital Signs: Reviewed and stable  Complications: No apparent anesthesia complications

## 2019-02-15 NOTE — Anesthesia Procedure Notes (Signed)
Procedure Name: LMA Insertion Date/Time: 02/15/2019 10:49 AM Performed by: Cameron Ali, CRNA Pre-anesthesia Checklist: Patient identified, Emergency Drugs available, Suction available, Timeout performed and Patient being monitored Patient Re-evaluated:Patient Re-evaluated prior to induction Oxygen Delivery Method: Circle system utilized Preoxygenation: Pre-oxygenation with 100% oxygen Induction Type: IV induction LMA: LMA inserted LMA Size: 4.0 Number of attempts: 1 Placement Confirmation: positive ETCO2 and breath sounds checked- equal and bilateral Tube secured with: Tape Dental Injury: Teeth and Oropharynx as per pre-operative assessment

## 2019-02-15 NOTE — Anesthesia Procedure Notes (Signed)
Anesthesia Regional Block: Interscalene brachial plexus block   Pre-Anesthetic Checklist: ,, timeout performed, Correct Patient, Correct Site, Correct Laterality, Correct Procedure, Correct Position, site marked, Risks and benefits discussed,  Surgical consent,  Pre-op evaluation,  At surgeon's request and post-op pain management  Laterality: Left  Prep: chloraprep       Needles:  Injection technique: Single-shot  Needle Type: Stimiplex     Needle Length: 10cm  Needle Gauge: 21     Additional Needles:   Procedures:,,,, ultrasound used (permanent image in chart),,,,  Narrative:  Start time: 02/15/2019 9:46 AM End time: 02/15/2019 9:53 AM Injection made incrementally with aspirations every 5 mL.  Performed by: Personally  Anesthesiologist: Ronelle Nigh, MD  Additional Notes: Functioning IV was confirmed and monitors applied. Ultrasound guidance: relevant anatomy identified, needle position confirmed, local anesthetic spread visualized around nerve(s)., vascular puncture avoided.  Image printed for medical record.  Negative aspiration and no paresthesias; incremental administration of local anesthetic. The patient tolerated the procedure well. Vitals signes recorded in RN notes.

## 2019-02-18 ENCOUNTER — Encounter: Payer: Self-pay | Admitting: *Deleted

## 2019-05-23 ENCOUNTER — Ambulatory Visit: Payer: 59 | Attending: Internal Medicine

## 2019-05-23 DIAGNOSIS — Z23 Encounter for immunization: Secondary | ICD-10-CM

## 2019-05-23 NOTE — Progress Notes (Signed)
   Covid-19 Vaccination Clinic  Name:  Carlos Hammond    MRN: [id number] DOB: 10/07/1955  05/23/2019  [name] was observed post Covid-19 immunization for 15 minutes without incident. He was provided with Vaccine Information Sheet and instruction to access the V-Safe system.   [name] was instructed to call 911 with any severe reactions post vaccine: [name] Kitchen Difficulty breathing  . Swelling of face and throat  . A fast heartbeat  . A bad rash all over body  . Dizziness and weakness   Immunizations Administered    Name Date Dose VIS Date Route   Pfizer COVID-19 Vaccine 05/23/2019 11:45 AM 0.3 mL 02/15/2019 Intramuscular   Manufacturer: [place]   Lot: [id number]   [place]: [id number]

## 2019-05-25 ENCOUNTER — Ambulatory Visit: Payer: 59

## 2019-06-18 ENCOUNTER — Ambulatory Visit: Payer: 59 | Attending: Internal Medicine

## 2019-06-18 DIAGNOSIS — Z23 Encounter for immunization: Secondary | ICD-10-CM

## 2019-06-18 NOTE — Progress Notes (Signed)
   Covid-19 Vaccination Clinic  Name:  Carlos Hammond    MRN: [id number] DOB: [birth year]  06/18/2019  [name] was observed post Covid-19 immunization for 15 minutes without incident. He was provided with Vaccine Information Sheet and instruction to access the V-Safe system.   [name] was instructed to call 911 with any severe reactions post vaccine: [name] Kitchen Difficulty breathing  . Swelling of face and throat  . A fast heartbeat  . A bad rash all over body  . Dizziness and weakness   Immunizations Administered    Name Date Dose VIS Date Route   Pfizer COVID-19 Vaccine 06/18/2019  4:31 PM 0.3 mL 02/15/2019 Intramuscular   Manufacturer: [place]   Lot: [id number]   [place]: [id number]

## 2020-07-29 ENCOUNTER — Ambulatory Visit
Admission: EM | Admit: 2020-07-29 | Discharge: 2020-07-29 | Disposition: A | Discharge status: Home / Self Care | Payer: 59 | Attending: Sports Medicine | Admitting: Sports Medicine

## 2020-07-29 ENCOUNTER — Other Ambulatory Visit: Payer: Self-pay

## 2020-07-29 ENCOUNTER — Encounter: Payer: Self-pay | Admitting: Emergency Medicine

## 2020-07-29 DIAGNOSIS — W57XXXA Bitten or stung by nonvenomous insect and other nonvenomous arthropods, initial encounter: Secondary | ICD-10-CM | POA: Diagnosis not present

## 2020-07-29 DIAGNOSIS — S70362A Insect bite (nonvenomous), left thigh, initial encounter: Secondary | ICD-10-CM | POA: Diagnosis not present

## 2020-07-29 DIAGNOSIS — L299 Pruritus, unspecified: Secondary | ICD-10-CM

## 2020-07-29 MED ORDER — DOXYCYCLINE HYCLATE 100 MG PO CAPS: 100.0000 mg | ORAL_CAPSULE | Freq: Two times a day (BID) | ORAL | 0 refills | Status: DC

## 2020-07-29 NOTE — ED Provider Notes (Signed)
[facility]    CSN: [id number] Arrival date & time: 07/29/20  2836      History   Chief Complaint Chief Complaint  Patient presents with  . Insect Bite    tick    HPI Carlos Hammond is a 65 y.o. male.   Patient is a pleasant 65 year old male who presents for evaluation of the above issue.  He normally sees Dr. Jinny Blossom for his ongoing medical care.  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  He noticed a tick that was engorged in his left upper leg in the thigh area about 4 days ago.  He has 2 areas he is concerned about.  He only found one tick.  He thinks he was able to remove it totally.  Since then the tick bite has been fairly pruritic and swollen.  He has been scratching it.  It is bled a little bit.  He thinks he scratches at night because he does not realize he is doing it.  No fever shakes chills.  No nausea vomiting or diarrhea.  No chest pain or shortness of breath.  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  On presentation he is 51 and asymptomatic.  No chest pain shortness of breath.  No red flag signs or symptoms elicited on history.     Past Medical History:  Diagnosis Date  . Arthritis    knee  . Diabetes mellitus, type 2 ([place])   . Sleep apnea    CPAP  . Wears contact lenses     There are no problems to display for this patient.   Past Surgical History:  Procedure Laterality Date  . ELBOW SURGERY     1990s - Left(x2), right(x1)  . KNEE ARTHROSCOPY WITH MEDIAL MENISECTOMY Left 05/09/2017   Procedure: KNEE ARTHROSCOPY WITH PARTIAL  MEDIAL MENISECTOMY CHONDROPLASTY;  Surgeon: Leim Fabry, MD;  Location: [place];  Service: Orthopedics;  Laterality: Left;  SUPINE WITH LATERAL POST Diabetic - oral meds sleep apnea  . SHOULDER ARTHROSCOPY WITH SUBACROMIAL DECOMPRESSION AND BICEP TENDON REPAIR Left 02/15/2019   Procedure: SHOULDER ARTHROSCOPY WITH SUBACROMIAL DECOMPRESSION AND BICEP  TENDONODESISs,  Subscapularis Repair, Regenten Patch Application;  Surgeon: Leim Fabry, MD;  Location: [place];  Service: Orthopedics;  Laterality: Left;  Diabetic - oal meds sleep apnea  . VASECTOMY         Home Medications    Prior to Admission medications   Medication Sig Start Date End Date Taking? Authorizing Provider  atorvastatin (LIPITOR) 40 MG tablet Take 40 mg by mouth daily.   Yes [provider]  doxycycline (VIBRAMYCIN) 100 MG capsule Take 1 capsule (100 mg total) by mouth 2 (two) times daily. 07/29/20  Yes Verda Cumins, MD  empagliflozin (JARDIANCE) 25 MG TABS tablet Take 25 mg by mouth daily.   Yes [provider]  glipiZIDE (GLUCOTROL) 10 MG tablet Take 20 mg by mouth daily before breakfast.   Yes [provider]  losartan (COZAAR) 50 MG tablet Take 1 tablet by mouth daily. 03/26/20 03/26/21 Yes [provider]  metFORMIN (GLUCOPHAGE) 500 MG tablet Take 2,000 mg by mouth daily with breakfast.   Yes [provider]  Multiple Vitamin (MULTIVITAMIN) tablet Take 1 tablet by mouth daily.   Yes [provider]  NIACIN PO Take by mouth daily.   Yes [provider]  testosterone (ANDROGEL) 50 MG/5GM (1%) GEL Place  5 g onto the skin daily.   Yes [provider]  ondansetron (ZOFRAN ODT) 4 MG disintegrating tablet Take 1 tablet (4 mg total) by mouth every 8 (eight) hours as needed for nausea or vomiting. 02/15/19   Leim Fabry, MD    Family History No family history on file.  Social History Social History   Tobacco Use  . Smoking status: Former Smoker    Types: Cigarettes    Quit date: 12/19/1988    Years since quitting: 31.6  . Smokeless tobacco: Former Systems developer    Types: Chew    Quit date: 12/22/1990  . Tobacco comment: was social smoker  Vaping Use  . Vaping Use: Never used  Substance Use Topics  . Alcohol use: Yes    Comment: 2 drinks/month     Allergies    Penicillins   Review of Systems Review of Systems  Constitutional: Negative for appetite change, chills, diaphoresis, fatigue and fever.  HENT: Negative for congestion, ear pain, postnasal drip, rhinorrhea, sinus pressure, sinus pain, sneezing and sore throat.   Eyes: Negative for pain.  Respiratory: Negative for cough, chest tightness and shortness of breath.   Cardiovascular: Negative for chest pain and palpitations.  Gastrointestinal: Negative for abdominal pain, diarrhea, nausea and vomiting.  Genitourinary: Negative for dysuria.  Musculoskeletal: Negative for back pain, myalgias and neck pain.  Skin: Positive for color change and wound. Negative for pallor and rash.  Neurological: Negative for dizziness, light-headedness, numbness and headaches.  All other systems reviewed and are negative.    Physical Exam Triage Vital Signs ED Triage Vitals  Enc Vitals Group     BP 07/29/20 1002 138/82     Pulse Rate 07/29/20 1002 (!) 55     Resp 07/29/20 1002 18     Temp 07/29/20 1002 98.4 F (36.9 C)     Temp Source 07/29/20 1002 Oral     SpO2 07/29/20 1002 100 %     Weight 07/29/20 1003 166 lb 0.1 oz (75.3 kg)     Height 07/29/20 1003 5\' 7"  (1.702 m)     Head Circumference --      Peak Flow --      Pain Score 07/29/20 1002 5     Pain Loc --      Pain Edu? --      Excl. in [place]? --    No data found.  Updated Vital Signs BP 138/82 (BP Location: Right Arm)   Pulse (!) 55   Temp 98.4 F (36.9 C) (Oral)   Resp 18   Ht 5\' 7"  (1.702 m)   Wt 75.3 kg   SpO2 100%   BMI 26.00 kg/m   Visual Acuity Right Eye Distance:   Left Eye Distance:   Bilateral Distance:    Right Eye Near:   Left Eye Near:    Bilateral Near:     Physical Exam Vitals and nursing note reviewed.  Constitutional:      General: He is not in acute distress.    Appearance: Normal appearance. He is not ill-appearing or diaphoretic.  HENT:     Head: Normocephalic and atraumatic.     Nose: Nose normal. No  congestion.     Mouth/Throat:     Mouth: Mucous membranes are moist.  Eyes:     General: No scleral icterus.       Right eye: No discharge.        Left eye: No discharge.     Extraocular Movements: Extraocular  movements intact.     Conjunctiva/sclera: Conjunctivae normal.     Pupils: Pupils are equal, round, and reactive to light.  Cardiovascular:     Rate and Rhythm: Normal rate and regular rhythm.     Pulses: Normal pulses.     Heart sounds: Normal heart sounds. No murmur heard. No friction rub. No gallop.   Pulmonary:     Effort: Pulmonary effort is normal.     Breath sounds: Normal breath sounds. No stridor. No wheezing, rhonchi or rales.  Musculoskeletal:     Cervical back: Normal range of motion and neck supple.  Skin:    General: Skin is warm and dry.     Capillary Refill: Capillary refill takes less than 2 seconds.     Findings: Erythema and lesion present.     Comments: Patient has 2 red raised lesions on the inner thigh of the left leg.  One of them has some dried blood over them with some excoriations.  Minimal tenderness to palpation.  No discharge.  No evidence of abscess.  No phlebitis.  Neurological:     General: No focal deficit present.     Mental Status: He is alert and oriented to person, place, and time.  Psychiatric:        Mood and Affect: Mood normal.      UC Treatments / Results  Labs (all labs ordered are listed, but only abnormal results are displayed) Labs Reviewed - No data to display  EKG   Radiology No results found.  Procedures Procedures (including critical care time)  Medications Ordered in UC Medications - No data to display  Initial Impression / Assessment and Plan / UC Course  I have reviewed the triage vital signs and the nursing notes.  Pertinent labs & imaging results that were available during my care of the patient were reviewed by me and considered in my medical decision making (see chart for details).  Clinical  impression: Tick exposure, patient removed the tick 4 days ago.  Will empirically treat.  Treatment plan: 1.  The findings and treatment plan were discussed in detail with the patient.  Patient was in agreement. 2.  We will go ahead and treat him with doxycycline.  Sent into his pharmacy. 3.  Educational handouts provided. 4.  Topical Benadryl, hydrocortisone cream, or calamine lotion for the itch in that area. 5.  Counseled him on sun exposure on the antibiotic.  Also provided educational handout on this. 6.  Oral Benadryl at night to help with the itch so he does not scratch it. 7.  Watch the wounds closely and if there is any concern about a superimposed infection see his primary care physician, come back here, or go to the ER. 8.  He did not need a work note. 9.  He was discharged in stable condition and will follow-up here as needed.    Final Clinical Impressions(s) / UC Diagnoses   Final diagnoses:  Tick bite of left thigh, initial encounter  Pruritus     Discharge Instructions     As we discussed, I am going to go ahead and treat you for a tickborne illness. Medication has been sent to your pharmacy. Educational handouts provided. Topical Benadryl, hydrocortisone cream, or calamine lotion for the itchiness. Oral Benadryl at nighttime as it can cause drowsiness for any itchiness that you are having at night. If symptoms persist please see your primary care physician. If symptoms worsen then please go to the ER.  ED Prescriptions    Medication Sig Dispense Auth. Provider   doxycycline (VIBRAMYCIN) 100 MG capsule Take 1 capsule (100 mg total) by mouth 2 (two) times daily. 20 capsule Verda Cumins, MD     PDMP not reviewed this encounter.   Verda Cumins, MD 07/29/20 1050

## 2020-07-29 NOTE — Discharge Instructions (Addendum)
As we discussed, I am going to go ahead and treat you for a tickborne illness. Medication has been sent to your pharmacy. Educational handouts provided. Topical Benadryl, hydrocortisone cream, or calamine lotion for the itchiness. Oral Benadryl at nighttime as it can cause drowsiness for any itchiness that you are having at night. If symptoms persist please see your primary care physician. If symptoms worsen then please go to the ER.

## 2020-07-29 NOTE — ED Triage Notes (Signed)
Pt has 2 tick bites on his left upper leg. He noticed them about 3 days ago. He believed the area is infected. Area is red, and itchy.  Denies fevers or body aches.

## 2020-12-19 IMAGING — MR MR SHOULDER*L* W/O CM
5 series · 33 of 40 positions shown · non-contrast
Comparison: None.

CLINICAL DATA: Chronic left shoulder pain.  No known injury.

EXAM:
MRI OF THE LEFT SHOULDER WITHOUT CONTRAST
TECHNIQUE: Multiplanar, multisequence MR imaging of the shoulder was performed.
No intravenous contrast was administered.

[Series 6: PD fat-sat · axial · left · 4.0mm · 0.55mm/px · z∈[-100,+37]mm · 8 of 30 slices shown (1 of 2)]
[im 1/30]
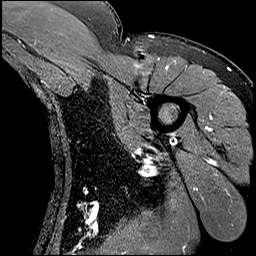
[im 4/30]
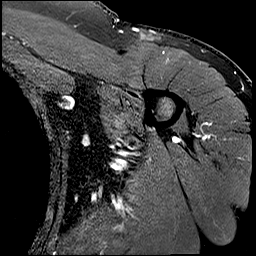
[im 10/30]
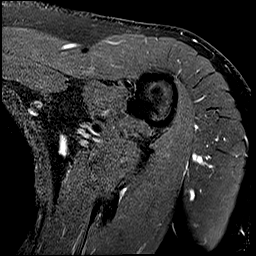
[im 13/30]
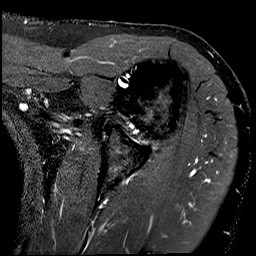
[im 17/30]
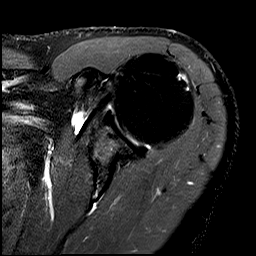
[im 20/30]
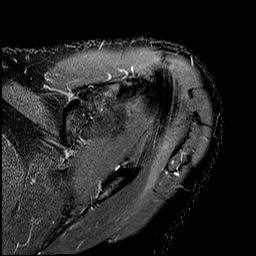
[im 26/30]
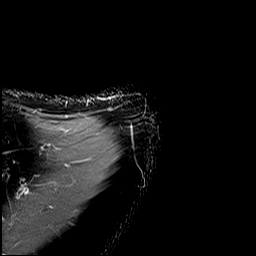
[im 30/30]
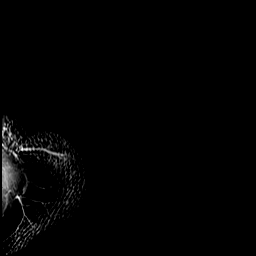

[Series 8: T2 fat-sat · oblique · left · 4.0mm · 0.44mm/px · 8 of 26 slices shown (1 of 2)]
[im 1/26]
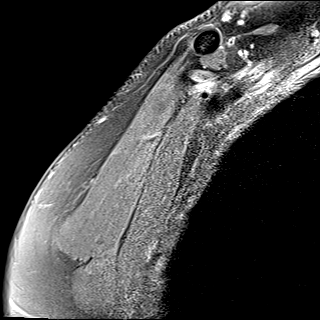
[im 4/26]
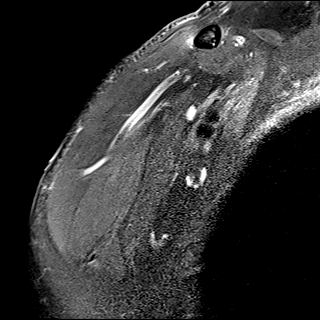
[im 8/26]
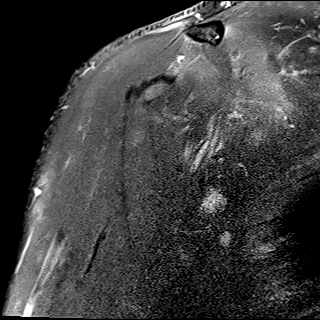
[im 11/26]
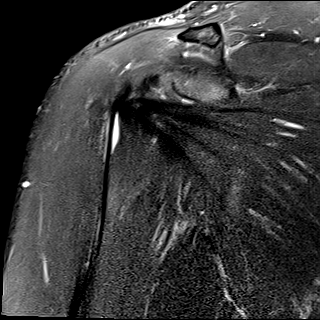
[im 15/26]
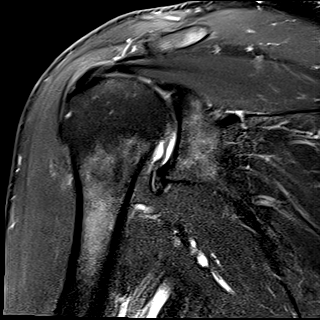
[im 18/26]
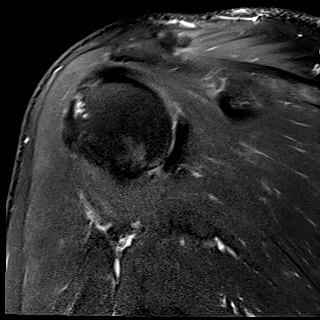
[im 22/26]
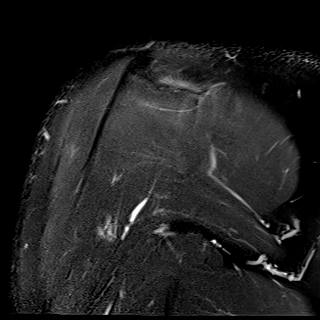
[im 26/26]
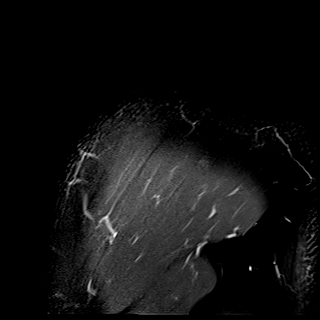

[Series 9: PD fat-sat · oblique · left · 4.0mm · 0.44mm/px · 8 of 26 slices shown (2 of 2)]
[im 1/26]
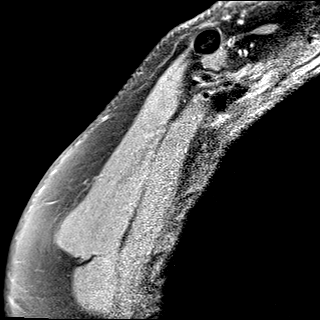
[im 4/26]
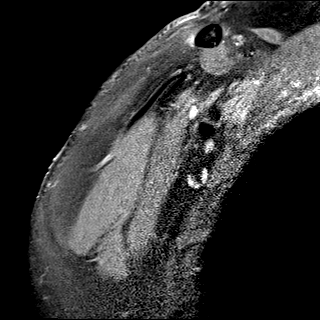
[im 8/26]
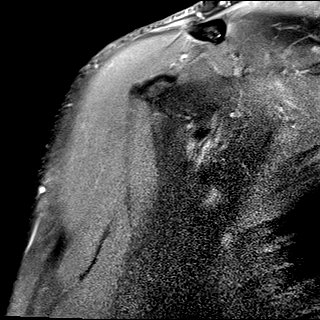
[im 11/26]
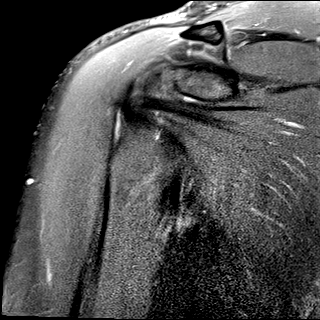
[im 15/26]
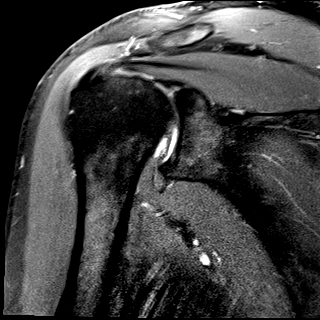
[im 18/26]
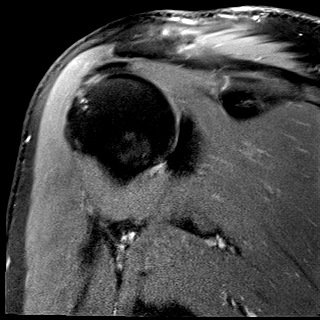
[im 22/26]
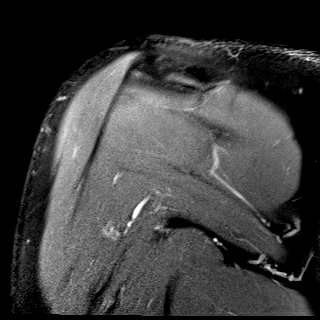
[im 26/26]
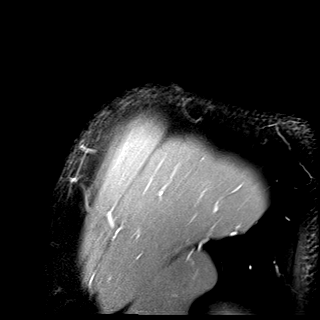

[Series 10: T2 fat-sat · oblique · left · 4.0mm · 0.23mm/px · 7 of 24 slices shown (2 of 2)]
[im 1/24]
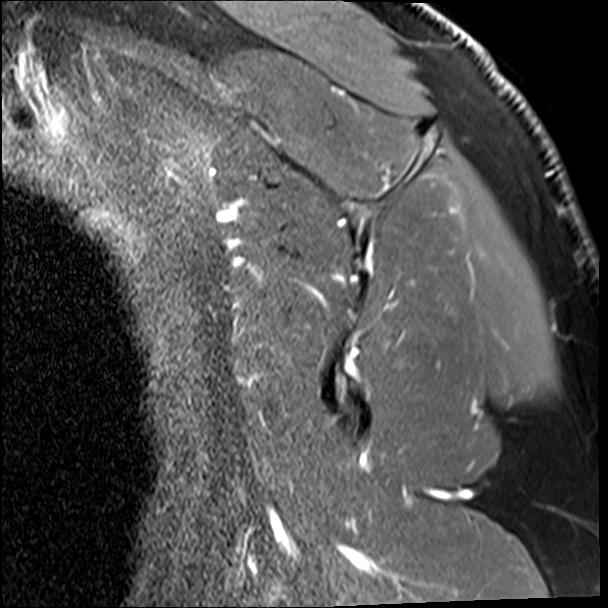
[im 4/24]
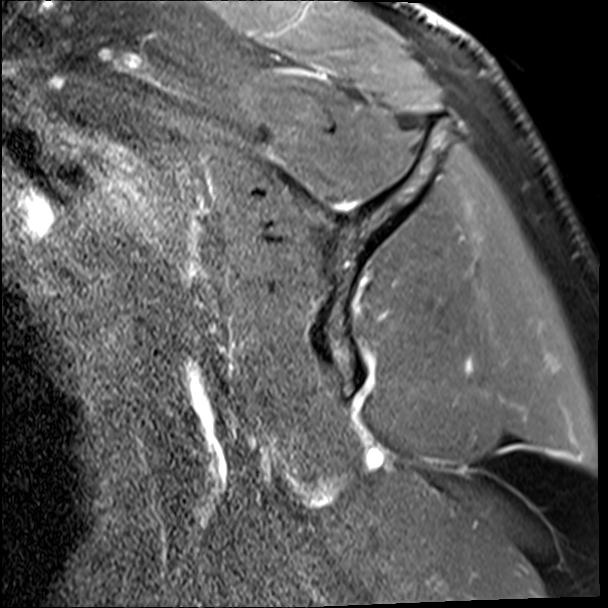
[im 8/24]
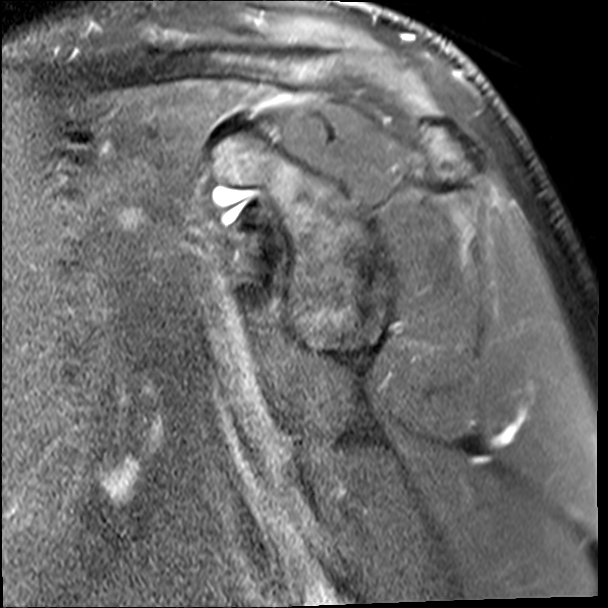
[im 12/24]
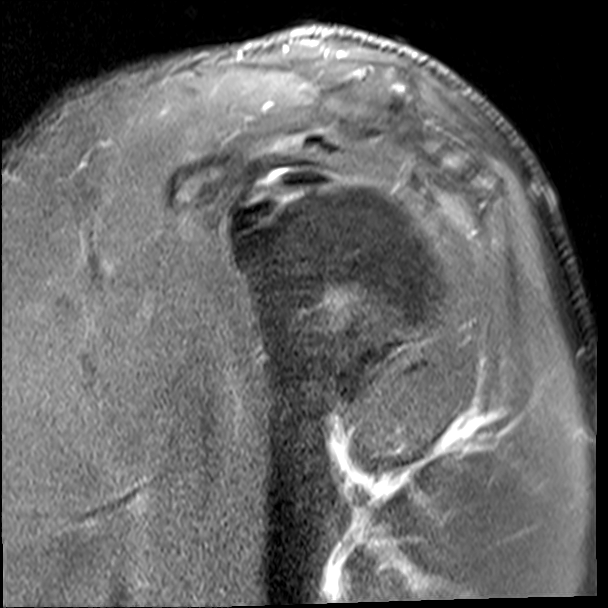
[im 16/24]
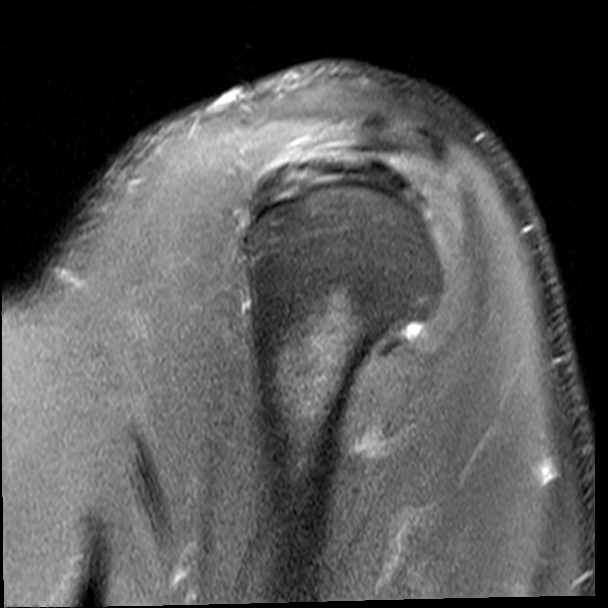
[im 20/24]
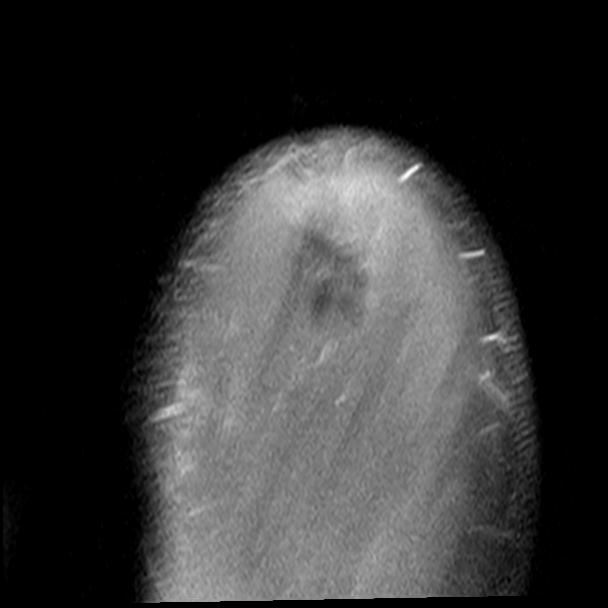
[im 24/24]
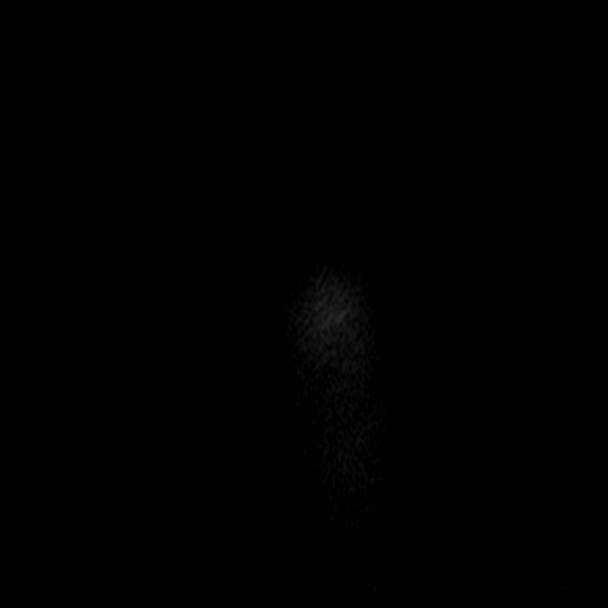

[Series 11: T1 · oblique · left · 4.0mm · 0.36mm/px · 2 of 24 slices shown]
[im 1/24]
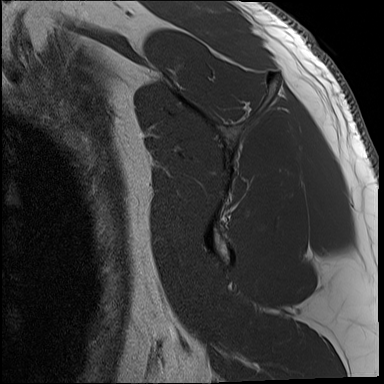
[im 4/24]
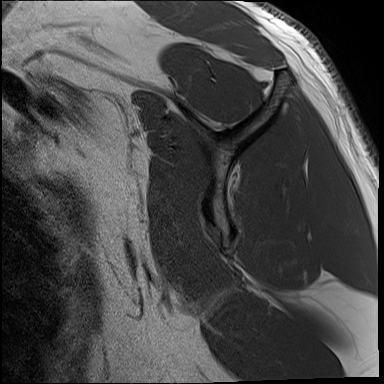

[33 of 40 positions shown; findings below may reference images not displayed]

FINDINGS: Rotator cuff: Mild, heterogeneously increased T2 signal in the
rotator cuff tendons consistent with tendinopathy is most notable in
the supraspinatus. The rotator cuff is intact.

Muscles:  Normal without atrophy or focal lesion.

Biceps long head: Intact. Mild, intermediate increased T2 signal in
the intra-articular segment of the tendon consistent with
tendinopathy noted.

Acromioclavicular Joint: Mild osteoarthritis. Type 2 acromion. No
subacromial/subdeltoid bursal fluid.

Glenohumeral Joint: Appears normal.

Labrum:  Intact.

Bones:  No fracture or worrisome lesion.

Other: None.
IMPRESSION: Mild appearing rotator cuff and intra-articular long head of biceps
tendinopathy without tear.

Mild acromioclavicular osteoarthritis.

## 2021-01-10 ENCOUNTER — Ambulatory Visit
Admission: EM | Admit: 2021-01-10 | Discharge: 2021-01-10 | Disposition: A | Discharge status: Home / Self Care | Payer: BC Managed Care – PPO | Attending: Internal Medicine | Admitting: Internal Medicine

## 2021-01-10 ENCOUNTER — Other Ambulatory Visit: Payer: Self-pay

## 2021-01-10 DIAGNOSIS — B9689 Other specified bacterial agents as the cause of diseases classified elsewhere: Secondary | ICD-10-CM

## 2021-01-10 DIAGNOSIS — J019 Acute sinusitis, unspecified: Secondary | ICD-10-CM | POA: Diagnosis not present

## 2021-01-10 HISTORY — DX: Essential (primary) hypertension: I10

## 2021-01-10 MED ORDER — DOXYCYCLINE HYCLATE 100 MG PO CAPS: 100.0000 mg | ORAL_CAPSULE | Freq: Two times a day (BID) | ORAL | 0 refills | Status: AC

## 2021-01-10 NOTE — Discharge Instructions (Addendum)
Continue Flonase, Robitussin use. Maintain adequate hydration Humidifier use is recommended Take antibiotics as prescribed.

## 2021-01-10 NOTE — ED Provider Notes (Signed)
[facility]    CSN: [id number] Arrival date & time: 01/10/21  0931      History   Chief Complaint Chief Complaint  Patient presents with   Cough    HPI Carlos Hammond is a 65 y.o. male with a history of seasonal allergies comes to the urgent care with a 10-day history of purulent nasal discharge, postnasal drip, sore throat and a cough which is worse at night.  Patient's symptoms started insidiously and has been persistent.  He has tried over-the-counter Robitussin, Mucinex and Flonase with no relief.  He started experiencing some facial pressure and nasal congestion.  No fever or chills.  He is tested negative for COVID on 3 occasions using the home COVID test.  No shortness of breath or wheezing.  No chest tightness or chest pain.   HPI  Past Medical History:  Diagnosis Date   Arthritis    knee   Diabetes mellitus, type 2 ([place])    Hypertension    Sleep apnea    CPAP   Wears contact lenses     There are no problems to display for this patient.   Past Surgical History:  Procedure Laterality Date   ELBOW SURGERY     1990s - Left(x2), right(x1)   KNEE ARTHROSCOPY WITH MEDIAL MENISECTOMY Left 05/09/2017   Procedure: KNEE ARTHROSCOPY WITH PARTIAL  MEDIAL MENISECTOMY CHONDROPLASTY;  Surgeon: Leim Fabry, MD;  Location: [place];  Service: Orthopedics;  Laterality: Left;  SUPINE WITH LATERAL POST Diabetic - oral meds sleep apnea   SHOULDER ARTHROSCOPY WITH SUBACROMIAL DECOMPRESSION AND BICEP TENDON REPAIR Left 02/15/2019   Procedure: SHOULDER ARTHROSCOPY WITH SUBACROMIAL DECOMPRESSION AND BICEP TENDONODESISs,  Subscapularis Repair, [place] Patch Application;  Surgeon: Leim Fabry, MD;  Location: [place];  Service: Orthopedics;  Laterality: Left;  Diabetic - oal meds sleep apnea   VASECTOMY         Home Medications    Prior to Admission medications   Medication Sig Start Date End Date Taking? Authorizing Provider  atorvastatin  (LIPITOR) 40 MG tablet Take 40 mg by mouth daily.   Yes [provider]  doxycycline (VIBRAMYCIN) 100 MG capsule Take 1 capsule (100 mg total) by mouth 2 (two) times daily for 7 days. 01/10/21 01/17/21 Yes Macallan Ord, Myrene Galas, MD  empagliflozin (JARDIANCE) 25 MG TABS tablet Take 25 mg by mouth daily.   Yes [provider]  glipiZIDE (GLUCOTROL) 10 MG tablet Take 20 mg by mouth daily before breakfast.   Yes [provider]  losartan (COZAAR) 50 MG tablet Take 1 tablet by mouth daily. 03/26/20 03/26/21 Yes [provider]  metFORMIN (GLUCOPHAGE) 500 MG tablet Take 2,000 mg by mouth daily with breakfast.   Yes [provider]  Multiple Vitamin (MULTIVITAMIN) tablet Take 1 tablet by mouth daily.   Yes [provider]  NIACIN PO Take by mouth daily.   Yes [provider]    Family History History reviewed. No pertinent family history.  Social History Social History   Tobacco Use   Smoking status: Former    Types: Cigarettes    Quit date: 12/19/1988    Years since quitting: 32.0   Smokeless tobacco: Former    Types: Chew    Quit date: 12/22/1990   Tobacco comments:    was social smoker  Vaping Use   Vaping Use: Never used  Substance Use Topics   Alcohol use: Yes    Comment: 2 drinks/month     Allergies  Penicillins   Review of Systems Review of Systems  HENT:  Positive for congestion, postnasal drip, sinus pressure, sinus pain and sore throat. Negative for ear discharge, ear pain, tinnitus and voice change.   Eyes: Negative.   Cardiovascular: Negative.   Gastrointestinal: Negative.     Physical Exam Triage Vital Signs ED Triage Vitals  Enc Vitals Group     BP 01/10/21 1100 124/88     Pulse Rate 01/10/21 1100 88     Resp 01/10/21 1100 18     Temp 01/10/21 1100 98.3 F (36.8 C)     Temp Source 01/10/21 1100 Oral     SpO2 01/10/21 1100 99 %     Weight 01/10/21 1059 170 lb (77.1 kg)     Height 01/10/21 1059 5'  7" (1.702 m)     Head Circumference --      Peak Flow --      Pain Score 01/10/21 1059 0     Pain Loc --      Pain Edu? --      Excl. in [place]? --    No data found.  Updated Vital Signs BP 124/88 (BP Location: Left Arm)   Pulse 88   Temp 98.3 F (36.8 C) (Oral)   Resp 18   Ht 5\' 7"  (1.702 m)   Wt 77.1 kg   SpO2 99%   BMI 26.63 kg/m   Visual Acuity Right Eye Distance:   Left Eye Distance:   Bilateral Distance:    Right Eye Near:   Left Eye Near:    Bilateral Near:     Physical Exam Vitals and nursing note reviewed.  Constitutional:      General: He is not in acute distress.    Appearance: He is not ill-appearing.  HENT:     Right Ear: Tympanic membrane normal.     Left Ear: Tympanic membrane normal.     Nose: Congestion present.     Mouth/Throat:     Pharynx: No posterior oropharyngeal erythema.  Eyes:     Extraocular Movements: Extraocular movements intact.     Pupils: Pupils are equal, round, and reactive to light.  Cardiovascular:     Rate and Rhythm: Normal rate and regular rhythm.     Pulses: Normal pulses.     Heart sounds: Normal heart sounds.  Pulmonary:     Effort: Pulmonary effort is normal.     Breath sounds: Normal breath sounds.  Neurological:     Mental Status: He is alert.     UC Treatments / Results  Labs (all labs ordered are listed, but only abnormal results are displayed) Labs Reviewed - No data to display  EKG   Radiology No results found.  Procedures Procedures (including critical care time)  Medications Ordered in UC Medications - No data to display  Initial Impression / Assessment and Plan / UC Course  I have reviewed the triage vital signs and the nursing notes.  Pertinent labs & imaging results that were available during my care of the patient were reviewed by me and considered in my medical decision making (see chart for details).     1.  Acute bacterial sinusitis greater than 10 days duration: Doxycycline 100 mg  twice daily for 5 days Continue Flonase use Salt water nasal spray Over-the-counter remedies for nasal congestion If symptoms worsen please return to urgent care to be reevaluated. Final Clinical Impressions(s) / UC Diagnoses   Final diagnoses:  Acute bacterial sinusitis  Discharge Instructions      Continue Flonase, Robitussin use. Maintain adequate hydration Humidifier use is recommended Take antibiotics as prescribed.   ED Prescriptions     Medication Sig Dispense Auth. Provider   doxycycline (VIBRAMYCIN) 100 MG capsule Take 1 capsule (100 mg total) by mouth 2 (two) times daily for 7 days. 14 capsule Ramzey Petrovic, Myrene Galas, MD      PDMP not reviewed this encounter.   Chase Picket, MD 01/10/21 1240

## 2021-01-10 NOTE — ED Triage Notes (Signed)
Pt here with C/O sore throat, cough for 10 days. Has tried OTC medication with no relief.  Does have some facial pressure

## 2021-09-16 ENCOUNTER — Other Ambulatory Visit: Payer: Self-pay | Admitting: Surgery

## 2021-09-27 ENCOUNTER — Encounter
Admission: RE | Admit: 2021-09-27 | Discharge: 2021-09-27 | Disposition: A | Discharge status: Home / Self Care | Payer: BC Managed Care – PPO | Source: Ambulatory Visit | Attending: Surgery | Admitting: Surgery

## 2021-09-27 VITALS — Ht 67.0 in | Wt 168.0 lb

## 2021-09-27 DIAGNOSIS — E119 Type 2 diabetes mellitus without complications: Secondary | ICD-10-CM

## 2021-09-27 DIAGNOSIS — Z01818 Encounter for other preprocedural examination: Secondary | ICD-10-CM

## 2021-09-27 NOTE — Patient Instructions (Addendum)
Your procedure is scheduled on:10-06-21 Wednesday Report to the Registration Desk on the 1st floor of the [facility].Then proceed to the 2nd floor Surgery Desk To find out your arrival time, please call [phone] between 1PM - 3PM on:10-05-21 Tuesday If your arrival time is 6:00 am, do not arrive prior to that time as the [facility] entrance doors do not open until 6:00 am.  REMEMBER: Instructions that are not followed completely may result in serious medical risk, up to and including death; or upon the discretion of your surgeon and anesthesiologist your surgery may need to be rescheduled.  Do not eat food after midnight the night before surgery.  No gum chewing, lozengers or hard candies.  You may however, drink Water up to 2 hours before you are scheduled to arrive for your surgery. Do not drink anything within 2 hours of your scheduled arrival time.  In addition, your doctor has ordered for you to drink the provided  Gatorade G2 Drinking this carbohydrate drink up to two hours before surgery helps to reduce insulin resistance and improve patient outcomes. Please complete drinking 2 hours prior to scheduled arrival time.  TAKE THESE MEDICATIONS THE MORNING OF SURGERY WITH A SIP OF WATER: -atorvastatin (LIPITOR)  Stop your metFORMIN (GLUCOPHAGE) 2 days prior to surgery-Last dose on 10-03-21 Sunday  Stop your empagliflozin (JARDIANCE) 3 days prior to surgery-Last dose on 10-02-21 Saturday  Stop your 81 mg Aspirin 7 days prior to surgery-Last dose on 09-27-21 Tuesday  One week prior to surgery: Stop Anti-inflammatories (NSAIDS) such as Advil, Aleve, Ibuprofen, Motrin, Naproxen, Naprosyn and Aspirin based products such as Excedrin, Goodys Powder, BC Powder.You may however, take Tylenol if needed for pain up until the day of surgery.  Stop ANY OVER THE COUNTER supplements/vitamins 7 days prior to surgery (Niacin, Zinc, and multivitamin)  No Alcohol for 24 hours before or after  surgery.  No Smoking including e-cigarettes for 24 hours prior to surgery.  No chewable tobacco products for at least 6 hours prior to surgery.  No nicotine patches on the day of surgery.  Do not use any "recreational" drugs for at least a week prior to your surgery.  Please be advised that the combination of cocaine and anesthesia may have negative outcomes, up to and including death. If you test positive for cocaine, your surgery will be cancelled.  On the morning of surgery brush your teeth with toothpaste and water, you may rinse your mouth with mouthwash if you wish. Do not swallow any toothpaste or mouthwash.  Use CHG Soap as directed on instruction sheet.  Do not wear jewelry, make-up, hairpins, clips or nail polish.  Do not wear lotions, powders, or perfumes.   Do not shave body from the neck down 48 hours prior to surgery just in case you cut yourself which could leave a site for infection.  Also, freshly shaved skin may become irritated if using the CHG soap.  Contact lenses, hearing aids and dentures may not be worn into surgery.  Do not bring valuables to the hospital. [facility] is not responsible for any missing/lost belongings or valuables.   Bring your C-PAP to the hospital with you  Notify your doctor if there is any change in your medical condition (cold, fever, infection).  Wear comfortable clothing (specific to your surgery type) to the hospital.  After surgery, you can help prevent lung complications by doing breathing exercises.  Take deep breaths and cough every 1-2 hours. Your doctor may order  a device called an Incentive Spirometer to help you take deep breaths. When coughing or sneezing, hold a pillow firmly against your incision with both hands. This is called "splinting." Doing this helps protect your incision. It also decreases belly discomfort.  If you are being admitted to the hospital overnight, leave your suitcase in the car. After surgery it may  be brought to your room.  If you are being discharged the day of surgery, you will not be allowed to drive home. You will need a responsible adult (18 years or older) to drive you home and stay with you that night.   If you are taking public transportation, you will need to have a responsible adult (18 years or older) with you. Please confirm with your physician that it is acceptable to use public transportation.   Please call the [facility] at [phone] if you have any questions about these instructions.  Surgery Visitation Policy:  Patients undergoing a surgery or procedure may have two family members or support persons with them as long as the person is not COVID-19 positive or experiencing its symptoms.   Inpatient Visitation:    Visiting hours are 7 a.m. to 8 p.m. Up to four visitors are allowed at one time in a patient room, including children. The visitors may rotate out with other people during the day. One designated support person (adult) may remain overnight.

## 2021-10-01 ENCOUNTER — Encounter
Admission: RE | Admit: 2021-10-01 | Discharge: 2021-10-01 | Disposition: A | Discharge status: Home / Self Care | Payer: BC Managed Care – PPO | Source: Ambulatory Visit | Attending: Surgery | Admitting: Surgery

## 2021-10-01 DIAGNOSIS — E119 Type 2 diabetes mellitus without complications: Secondary | ICD-10-CM | POA: Insufficient documentation

## 2021-10-01 DIAGNOSIS — Z01818 Encounter for other preprocedural examination: Secondary | ICD-10-CM | POA: Diagnosis present

## 2021-10-01 LAB — BASIC METABOLIC PANEL
Anion gap: 9 (ref 5–15)
BUN: 23 mg/dL (ref 8–23)
CO2: 27 mmol/L (ref 22–32)
Calcium: 9.7 mg/dL (ref 8.9–10.3)
Chloride: 106 mmol/L (ref 98–111)
Creatinine, Ser: 0.99 mg/dL (ref 0.61–1.24)
GFR, Estimated: >60 mL/min (ref >60)
Glucose, Bld: 185 mg/dL — ABNORMAL HIGH (ref 70–99)
Potassium: 3.9 mmol/L (ref 3.5–5.1)
Sodium: 142 mmol/L (ref 135–145)

## 2021-10-05 MED ORDER — SODIUM CHLORIDE 0.9 % IV SOLN: INTRAVENOUS | Status: DC

## 2021-10-05 MED ORDER — CEFAZOLIN SODIUM-DEXTROSE 2-4 GM/100ML-% IV SOLN
2.0000 g | INTRAVENOUS | Status: AC
  Administered 2021-10-06: 2 g via INTRAVENOUS

## 2021-10-05 MED ORDER — FAMOTIDINE 20 MG PO TABS: 20.0000 mg | ORAL_TABLET | Freq: Once | ORAL | Status: AC

## 2021-10-05 MED ORDER — ORAL CARE MOUTH RINSE: 15.0000 mL | Freq: Once | OROMUCOSAL | Status: AC

## 2021-10-05 MED ORDER — CHLORHEXIDINE GLUCONATE 0.12 % MT SOLN: 15.0000 mL | Freq: Once | OROMUCOSAL | Status: AC

## 2021-10-06 ENCOUNTER — Ambulatory Visit: Payer: BC Managed Care – PPO | Admitting: Urgent Care

## 2021-10-06 ENCOUNTER — Other Ambulatory Visit: Payer: Self-pay

## 2021-10-06 ENCOUNTER — Ambulatory Visit
Admission: RE | Admit: 2021-10-06 | Discharge: 2021-10-06 | Disposition: A | Discharge status: Home / Self Care | Payer: BC Managed Care – PPO | Attending: Surgery | Admitting: Surgery

## 2021-10-06 ENCOUNTER — Encounter: Payer: Self-pay | Admitting: Surgery

## 2021-10-06 ENCOUNTER — Encounter
Admission: RE | Disposition: A | Discharge status: Home / Self Care | Payer: Self-pay | Source: Home / Self Care | Attending: Surgery

## 2021-10-06 DIAGNOSIS — Z85828 Personal history of other malignant neoplasm of skin: Secondary | ICD-10-CM | POA: Insufficient documentation

## 2021-10-06 DIAGNOSIS — I10 Essential (primary) hypertension: Secondary | ICD-10-CM | POA: Insufficient documentation

## 2021-10-06 DIAGNOSIS — Z7984 Long term (current) use of oral hypoglycemic drugs: Secondary | ICD-10-CM | POA: Insufficient documentation

## 2021-10-06 DIAGNOSIS — M24541 Contracture, right hand: Secondary | ICD-10-CM | POA: Insufficient documentation

## 2021-10-06 DIAGNOSIS — Z01818 Encounter for other preprocedural examination: Secondary | ICD-10-CM

## 2021-10-06 DIAGNOSIS — Z79899 Other long term (current) drug therapy: Secondary | ICD-10-CM | POA: Insufficient documentation

## 2021-10-06 DIAGNOSIS — G4730 Sleep apnea, unspecified: Secondary | ICD-10-CM | POA: Insufficient documentation

## 2021-10-06 DIAGNOSIS — Z87891 Personal history of nicotine dependence: Secondary | ICD-10-CM | POA: Insufficient documentation

## 2021-10-06 DIAGNOSIS — M1710 Unilateral primary osteoarthritis, unspecified knee: Secondary | ICD-10-CM | POA: Diagnosis not present

## 2021-10-06 DIAGNOSIS — E119 Type 2 diabetes mellitus without complications: Secondary | ICD-10-CM | POA: Insufficient documentation

## 2021-10-06 HISTORY — PX: CAPSULOTOMY: SHX379

## 2021-10-06 LAB — GLUCOSE, CAPILLARY
Glucose-Capillary: 193 mg/dL — ABNORMAL HIGH (ref 70–99)
Glucose-Capillary: 215 mg/dL — ABNORMAL HIGH (ref 70–99)

## 2021-10-06 SURGERY — CAPSULOTOMY
Anesthesia: General | Site: Ring Finger | Laterality: Right

## 2021-10-06 MED ORDER — 0.9 % SODIUM CHLORIDE (POUR BTL) OPTIME
TOPICAL | Status: DC | PRN
  Administered 2021-10-06: 250 mL

## 2021-10-06 MED ORDER — ONDANSETRON HCL 4 MG/2ML IJ SOLN: 4.0000 mg | Freq: Once | INTRAMUSCULAR | Status: DC | PRN

## 2021-10-06 MED ORDER — METOCLOPRAMIDE HCL 5 MG/ML IJ SOLN: 5.0000 mg | Freq: Three times a day (TID) | INTRAMUSCULAR | Status: DC | PRN

## 2021-10-06 MED ORDER — ACETAMINOPHEN 10 MG/ML IV SOLN: 1000.0000 mg | Freq: Once | INTRAVENOUS | Status: DC | PRN

## 2021-10-06 MED ORDER — PROPOFOL 10 MG/ML IV BOLUS
INTRAVENOUS | Status: DC | PRN
  Administered 2021-10-06: 150 mg via INTRAVENOUS

## 2021-10-06 MED ORDER — FENTANYL CITRATE (PF) 100 MCG/2ML IJ SOLN
INTRAMUSCULAR | Status: AC
  Filled 2021-10-06: qty 2

## 2021-10-06 MED ORDER — ONDANSETRON HCL 4 MG PO TABS: 4.0000 mg | ORAL_TABLET | Freq: Four times a day (QID) | ORAL | Status: DC | PRN

## 2021-10-06 MED ORDER — OXYCODONE HCL 5 MG/5ML PO SOLN: 5.0000 mg | Freq: Once | ORAL | Status: DC | PRN

## 2021-10-06 MED ORDER — PHENYLEPHRINE HCL (PRESSORS) 10 MG/ML IV SOLN
INTRAVENOUS | Status: DC | PRN
  Administered 2021-10-06: 80 ug via INTRAVENOUS

## 2021-10-06 MED ORDER — GLYCOPYRROLATE 0.2 MG/ML IJ SOLN
INTRAMUSCULAR | Status: DC | PRN
  Administered 2021-10-06: .2 mg via INTRAVENOUS

## 2021-10-06 MED ORDER — SODIUM CHLORIDE 0.9 % IV SOLN: INTRAVENOUS | Status: DC

## 2021-10-06 MED ORDER — FENTANYL CITRATE (PF) 100 MCG/2ML IJ SOLN
INTRAMUSCULAR | Status: DC | PRN
  Administered 2021-10-06 (×2): 50 ug via INTRAVENOUS

## 2021-10-06 MED ORDER — LIDOCAINE HCL (CARDIAC) PF 100 MG/5ML IV SOSY
PREFILLED_SYRINGE | INTRAVENOUS | Status: DC | PRN
  Administered 2021-10-06: 80 mg via INTRAVENOUS

## 2021-10-06 MED ORDER — OXYCODONE HCL 5 MG PO TABS: 5.0000 mg | ORAL_TABLET | Freq: Once | ORAL | Status: DC | PRN

## 2021-10-06 MED ORDER — ONDANSETRON HCL 4 MG/2ML IJ SOLN: 4.0000 mg | Freq: Four times a day (QID) | INTRAMUSCULAR | Status: DC | PRN

## 2021-10-06 MED ORDER — MIDAZOLAM HCL 2 MG/2ML IJ SOLN
INTRAMUSCULAR | Status: AC
  Filled 2021-10-06: qty 2

## 2021-10-06 MED ORDER — HYDROCODONE-ACETAMINOPHEN 5-325 MG PO TABS: 1.0000 | ORAL_TABLET | ORAL | Status: DC | PRN

## 2021-10-06 MED ORDER — PROPOFOL 1000 MG/100ML IV EMUL
INTRAVENOUS | Status: AC
  Filled 2021-10-06: qty 200

## 2021-10-06 MED ORDER — BUPIVACAINE HCL (PF) 0.5 % IJ SOLN
INTRAMUSCULAR | Status: DC | PRN
  Administered 2021-10-06: 10 mL

## 2021-10-06 MED ORDER — CEFAZOLIN SODIUM-DEXTROSE 2-4 GM/100ML-% IV SOLN
INTRAVENOUS | Status: AC
  Filled 2021-10-06: qty 100

## 2021-10-06 MED ORDER — BUPIVACAINE HCL (PF) 0.5 % IJ SOLN
INTRAMUSCULAR | Status: AC
  Filled 2021-10-06: qty 30

## 2021-10-06 MED ORDER — CHLORHEXIDINE GLUCONATE 0.12 % MT SOLN
OROMUCOSAL | Status: AC
  Administered 2021-10-06: 15 mL via OROMUCOSAL
  Filled 2021-10-06: qty 15

## 2021-10-06 MED ORDER — MIDAZOLAM HCL 2 MG/2ML IJ SOLN
INTRAMUSCULAR | Status: DC | PRN
  Administered 2021-10-06: 2 mg via INTRAVENOUS

## 2021-10-06 MED ORDER — FENTANYL CITRATE (PF) 100 MCG/2ML IJ SOLN: 25.0000 ug | INTRAMUSCULAR | Status: DC | PRN

## 2021-10-06 MED ORDER — HYDROCODONE-ACETAMINOPHEN 5-325 MG PO TABS
1.0000 | ORAL_TABLET | Freq: Four times a day (QID) | ORAL | 0 refills | Status: AC | PRN
Start: 2021-10-06 — End: ?

## 2021-10-06 MED ORDER — METOCLOPRAMIDE HCL 10 MG PO TABS: 5.0000 mg | ORAL_TABLET | Freq: Three times a day (TID) | ORAL | Status: DC | PRN

## 2021-10-06 MED ORDER — ONDANSETRON HCL 4 MG/2ML IJ SOLN
INTRAMUSCULAR | Status: AC
  Filled 2021-10-06: qty 2

## 2021-10-06 MED ORDER — DEXAMETHASONE SODIUM PHOSPHATE 10 MG/ML IJ SOLN
INTRAMUSCULAR | Status: DC | PRN
  Administered 2021-10-06: 10 mg via INTRAVENOUS

## 2021-10-06 MED ORDER — LIDOCAINE HCL (PF) 2 % IJ SOLN
INTRAMUSCULAR | Status: AC
  Filled 2021-10-06: qty 5

## 2021-10-06 MED ORDER — FAMOTIDINE 20 MG PO TABS
ORAL_TABLET | ORAL | Status: AC
  Administered 2021-10-06: 20 mg via ORAL
  Filled 2021-10-06: qty 1

## 2021-10-06 SURGICAL SUPPLY — 32 items
BNDG COHESIVE 4X5 TAN ST LF (GAUZE/BANDAGES/DRESSINGS) ×1 IMPLANT
BNDG ELASTIC 2X5.8 VLCR STR LF (GAUZE/BANDAGES/DRESSINGS) ×2 IMPLANT
BNDG ELASTIC 3X5.8 VLCR STR LF (GAUZE/BANDAGES/DRESSINGS) ×1 IMPLANT
BNDG ESMARK 4X12 TAN STRL LF (GAUZE/BANDAGES/DRESSINGS) ×2 IMPLANT
CHLORAPREP W/TINT 26 (MISCELLANEOUS) ×2 IMPLANT
CORD BIP STRL DISP 12FT (MISCELLANEOUS) ×2 IMPLANT
CUFF TOURN SGL QUICK 18X4 (TOURNIQUET CUFF) ×1 IMPLANT
DRAPE SURG 17X11 SM STRL (DRAPES) ×2 IMPLANT
ELECT REM PT RETURN 9FT ADLT (ELECTROSURGICAL) ×2
ELECTRODE REM PT RTRN 9FT ADLT (ELECTROSURGICAL) ×1 IMPLANT
FORCEPS JEWEL BIP 4-3/4 STR (INSTRUMENTS) ×2 IMPLANT
GAUZE SPONGE 4X4 12PLY STRL (GAUZE/BANDAGES/DRESSINGS) ×2 IMPLANT
GAUZE STRETCH 2X75IN STRL (MISCELLANEOUS) ×1 IMPLANT
GAUZE XEROFORM 1X8 LF (GAUZE/BANDAGES/DRESSINGS) ×2 IMPLANT
GLOVE BIO SURGEON STRL SZ8 (GLOVE) ×4 IMPLANT
GLOVE SURG UNDER LTX SZ8 (GLOVE) ×2 IMPLANT
GOWN STRL REUS W/ TWL LRG LVL3 (GOWN DISPOSABLE) ×1 IMPLANT
GOWN STRL REUS W/ TWL XL LVL3 (GOWN DISPOSABLE) ×1 IMPLANT
GOWN STRL REUS W/TWL LRG LVL3 (GOWN DISPOSABLE) ×1
GOWN STRL REUS W/TWL XL LVL3 (GOWN DISPOSABLE) ×2
KIT TURNOVER KIT A (KITS) ×2 IMPLANT
MANIFOLD NEPTUNE II (INSTRUMENTS) ×2 IMPLANT
NS IRRIG 1000ML POUR BTL (IV SOLUTION) ×2 IMPLANT
NS IRRIG 500ML POUR BTL (IV SOLUTION) ×2 IMPLANT
PACK EXTREMITY ARMC (MISCELLANEOUS) ×2 IMPLANT
PAD PREP 24X41 OB/GYN DISP (PERSONAL CARE ITEMS) ×2 IMPLANT
SPONGE GAUZE 2X2 8PLY STRL LF (GAUZE/BANDAGES/DRESSINGS) ×2 IMPLANT
STOCKINETTE IMPERVIOUS 9X36 MD (GAUZE/BANDAGES/DRESSINGS) ×2 IMPLANT
SUT PROLENE 4 0 PS 2 18 (SUTURE) ×2 IMPLANT
SUT VIC AB 3-0 SH 27 (SUTURE)
SUT VIC AB 3-0 SH 27X BRD (SUTURE) ×1 IMPLANT
WATER STERILE IRR 500ML POUR (IV SOLUTION) ×2 IMPLANT

## 2021-10-06 NOTE — Progress Notes (Signed)
Patients glasses returned 

## 2021-10-06 NOTE — H&P (Signed)
History of Present Illness:  Carlos Hammond is a 66 y.o. male who presents for evaluation and treatment of his right ring finger pain and stiffness. The patient recalls sustaining a injury playing pickle ball in January, 2023. As he went for a backhand, he lost his balance and fell awkwardly, sustaining injuries to his face, shoulder, and right hand. The right hand injury was a transverse laceration through the PIP flexion crease from a PIP dislocation with exposure of the flexor tendon. This was treated by the ER provider at [facility] with reduction, irrigation and debridement, and primary closure. He went on to heal the wound well but has noted a persistent flexion deformity of the PIP joint. At his follow-up with the orthopedic hand surgeon, he was advised to start occupational therapy. At his first visit with occupational therapy, he was placed into a removable splint incorporating his long, ring, and little fingers, and advised to follow-up weekly for occupational therapy treatments. However, he did not follow-up with these appointments as instructed. On today's visit, he complains of 8/10 pain in the finger. His symptoms are aggravated by repetitive activities as well as if he accidentally bumps the finger. He has been taking ibuprofen as necessary with limited benefit. He also notes occasional numbness to the fingertip. He denies any more recent reinjury to the hand. He is right-hand dominant. He works full-time in an Therap[employer], art on a keyboard.  Current Outpatient Medications: *aspirin oral Take 81 mg by mouth once daily  atorvastatin (LIPITOR) 10 MG tablet Take 1 tablet (10 mg total) by mouth once daily 90 tablet 3  empagliflozin (JARDIANCE) 25 mg tablet Take 1 tablet (25 mg total) by mouth once daily 90 tablet 3  glipiZIDE (GLUCOTROL XL) 10 MG XL tablet Take 2 tablets (20 mg total) by mouth once daily 180 tablet 3  ketoconazole (NIZORAL) 2 % shampoo Apply topically 3 (three) times a week   losartan (COZAAR) 50 MG tablet Take 1 tablet (50 mg total) by mouth once daily 90 tablet 3  metFORMIN (GLUCOPHAGE-XR) 500 MG XR tablet Take 4 tablets (2,000 mg total) by mouth once daily 360 tablet 3  multivitamin (THERAGRAN) tablet Take 1 tablet by mouth once daily  triamcinolone 0.1 % ointment Apply topically continuously as needed   Allergies:  Penicillins Unknown   Past Medical History:  Allergic state 1960  Penicillin  Hyperlipidemia  Hypertension  Sleep apnea  Type 2 diabetes mellitus (CMS-HCC)   Past Surgical History:  COLONOSCOPY 2009  no polyps per patient  COLONOSCOPY 07/17/2017  Tubular adenoma/Repeat 11yr/TKT  left arthroscopic rotator cuff repair (subcapularis) L mini-open rotator cuff (supraspinatus) with Regeneten patch augmentation, L open biceps tenodesis, L extensive debridement of shldr (glenohumeral & subacromial spaces) Left 02/15/2019  Dr. PPosey Pronto FRACTURE SURGERY  VASECTOMY Bilateral  VASECTOMY   Family History  Problem Relation Age of Onset  Glaucoma Mother  Skin cancer Mother  Alzheimer's disease Father  Diabetes type II Father  Lung cancer Father  Hyperlipidemia (Elevated cholesterol) Neg Hx  Prostate cancer Neg Hx  Colon cancer Neg Hx  Coronary Artery Disease (Blocked arteries around heart) Neg Hx   Social History   Socioeconomic History  Marital status: Married  Tobacco Use  Smoking status: Former  Types: Cigarettes  Quit date: 12/19/1988  Years since quitting: 32.7  Smokeless tobacco: Former  Quit date: 12/22/1990  Vaping Use  Vaping Use: Never used  Substance and Sexual Activity  Alcohol use: Yes  Alcohol/week: 0.8 standard drinks  Types:  1 Standard drinks or equivalent per week  Comment: rarley  Drug use: No  Sexual activity: Yes  Partners: Female  Other Topics Concern  Would you please tell us about the people who live in your home, your pets, or anything else important to your social life? Yes  Comment: Wife, 3 dogs    Review of Systems:  A comprehensive 14 point ROS was performed, reviewed, and the pertinent orthopaedic findings are documented in the HPI.  Physical Exam: Vitals:  09/13/21 1440  BP: 136/74  Weight: 76.4 kg (168 lb 6.4 oz)  Height: 170.2 cm ('5\' 7"'$ )  PainSc: 8  PainLoc: Finger   General/Constitutional: The patient appears to be well-nourished, well-developed, and in no acute distress. Neuro/Psych: Normal mood and affect, oriented to person, place and time. Eyes: Non-icteric. Pupils are equal, round, and reactive to light, and exhibit synchronous movement. ENT: Unremarkable. Lymphatic: No palpable adenopathy. Respiratory: Lungs clear to auscultation, Normal chest excursion, No wheezes, and Non-labored breathing Cardiovascular: Regular rate and rhythm. No murmurs. and No edema, swelling or tenderness, except as noted in detailed exam. Integumentary: No impressive skin lesions present, except as noted in detailed exam. Musculoskeletal: Unremarkable, except as noted in detailed exam.  Right hand exam: Skin inspection of the right hand is notable for a well-healed transverse laceration across the PIP flexion crease which is without evidence for infection. His PIP joint lacks 30 degrees of extension but can be flexed beyond 100 degrees actively. Passively, the PIP joint lacks 25 degrees of extension but can be flexed beyond 110 degrees. He demonstrates the ability to actively flex the DIP and PIP joints individually, demonstrating that both the profundus and superficialis tendons are intact. He has intact sensation to light touch to the radial and ulnar aspects of his ring finger. He has good capillary refill to the fingertip.  X-rays/MRI/Lab data:  Recent x-rays of the right hand and ring finger are available for review. There is no evidence of fractures, lytic lesions, or significant degenerative changes.  Assessment: Flexion deformity of finger, right.   Plan: The treatment options  were discussed with the patient. In addition, patient educational materials were provided regarding the diagnosis and treatment options. The patient is quite frustrated by his symptoms and functional limitations, and would like to consider alternative treatment options. Based on his examination, I feel that he has a flexion contracture of the PIP joint, primarily involving the volar plate. Therefore, I have recommended a surgical procedure, specifically a release of the volar plate of the ring PIP joint. The procedure was discussed with the patient, as were the potential risks (including bleeding, infection, nerve and/or blood vessel injury, persistent or recurrent pain, persistent or recurrent stiffness of the finger, development of degenerative joint disease, weakness of grip, need for further surgery, blood clots, strokes, heart attacks and/or arhythmias, pneumonia, etc.) and benefits. The patient states his understanding and wishes to proceed. All of the patient's questions and concerns were answered. He can call any time with further concerns. He will follow up post-surgery, routine. This office visit took 40 minutes, of which 50% involved patient counseling/education.    H&P reviewed and patient re-examined. No changes.

## 2021-10-06 NOTE — Discharge Instructions (Addendum)
Orthopedic discharge instructions: Keep dressing dry and intact. Keep hand elevated above heart level. May shower after dressing removed on postop day 4 (Sunday). Cover sutures with Band-Aid after drying off, then re-apply splint to keep finger straight. Apply ice to affected area frequently. Take ibuprofen 600-800 mg TID with meals for 3-5 days, then as necessary. Take ES Tylenol or pain medication as prescribed when needed.  Return for follow-up in 10-14 days or as scheduled.   AMBULATORY SURGERY  DISCHARGE INSTRUCTIONS   The drugs that you were given will stay in your system until tomorrow so for the next 24 hours you should not:  Drive an automobile Make any legal decisions Drink any alcoholic beverage   You may resume regular meals tomorrow.  Today it is better to start with liquids and gradually work up to solid foods.  You may eat anything you prefer, but it is better to start with liquids, then soup and crackers, and gradually work up to solid foods.   Please notify your doctor immediately if you have any unusual bleeding, trouble breathing, redness and pain at the surgery site, drainage, fever, or pain not relieved by medication.    Additional Instructions:        Please contact your physician with any problems or Same Day Surgery at [phone], Monday through Friday 6 am to 4 pm, or [place] [facility] number at [phone].

## 2021-10-06 NOTE — Op Note (Signed)
10/06/2021  9:36 AM  Patient:   Carlos Hammond  Pre-Op Diagnosis:   Flexion contracture right ring PIP joint  Post-Op Diagnosis:   Same  Procedure:   Release of volar plate, right ring PIP joint  Surgeon:   Pascal Lux, MD  Assistant:   Almon Register, PA-S  Anesthesia:   General LMA  Findings:   As above.  Complications:   None  Fluids:   500 cc crystalloid  EBL:   1 cc  UOP:   None  TT:   27 minutes at 250 mmHg  Drains:   None  Closure:   4-0 Prolene interrupted sutures  Brief Clinical Note:   The patient is a 66 year old male who sustained an injury to his right ring finger while playing pickleball in January, 2023.  Subsequent x-rays confirm the presence of a PIP dislocation which was reduced and treated nonsurgically.  However, following this injury, he has continued to have difficulty with PIP joint extension, interfering with his ability to perform any normal daily activities.  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  He presents for release of the volar plate of his right ring PIP joint.  Procedure:   The patient was brought into the operating room and lain in the supine position.  After adequate general laryngeal mask anesthesia was obtained, the patient's right hand and upper extremity were prepped with ChloraPrep solution before being draped sterilely.  Preoperative antibiotics were administered.  A timeout was performed to verify the appropriate surgical site before the limb was exsanguinated and the tourniquet inflated to 250 mmHg.  A Brunner type incision was made over the volar aspect of the ring PIP joint.  The incision was carried down through the subcutaneous tissues with care taken to identify and protect the radial and ulnar neurovascular bundles the A3 pulley was removed to provide access to the tendons.  The FDS and FDP tendons were retracted to first the radial and then the ulnar sides to permit  visualization of the ulnar and radial slips of the volar plate respectively.  These were released sharply under direct visualization.  Subsequent manipulation of the PIP joint enabled the joint to be extended fully.  The wound was copiously irrigated with sterile saline solution before the skin was closed using 4-0 Prolene interrupted sutures.  A total of 10 cc of 0.5% plain Sensorcaine was used to perform a digital block to help with postoperative pain relief before a sterile bulky dressing was applied to the finger.  The patient was placed into a dorsal AlumaFoam splint to maintain the PIP and DIP joints in full extension.  The patient was then awakened, extubated, and returned to the recovery room in satisfactory condition after tolerating the procedure well.

## 2021-10-06 NOTE — Anesthesia Procedure Notes (Signed)
Procedure Name: LMA Insertion Date/Time: 10/06/2021 8:33 AM  Performed by: Terrence Dupont, RNPre-anesthesia Checklist: Patient identified, Patient being monitored, Timeout performed, Emergency Drugs available and Suction available Patient Re-evaluated:Patient Re-evaluated prior to induction Oxygen Delivery Method: Circle system utilized Preoxygenation: Pre-oxygenation with 100% oxygen Induction Type: IV induction Ventilation: Mask ventilation without difficulty LMA: LMA inserted LMA Size: 4.0 Tube type: Oral Number of attempts: 1 Placement Confirmation: positive ETCO2 and breath sounds checked- equal and bilateral Tube secured with: Tape Dental Injury: Teeth and Oropharynx as per pre-operative assessment

## 2021-10-06 NOTE — Anesthesia Postprocedure Evaluation (Signed)
Anesthesia Post Note  Patient: Carlos Hammond  Procedure(s) Performed: RELEASE OF VOLAR PLATE OF RIGHT RING PIP JOINT. (Right: Ring Finger)  Patient location during evaluation: PACU Anesthesia Type: General Level of consciousness: awake and alert Pain management: pain level controlled Vital Signs Assessment: post-procedure vital signs reviewed and stable Respiratory status: spontaneous breathing, nonlabored ventilation, respiratory function stable and patient connected to nasal cannula oxygen Cardiovascular status: blood pressure returned to baseline and stable Postop Assessment: no apparent nausea or vomiting Anesthetic complications: no   No notable events documented.   Last Vitals:  Vitals:   10/06/21 1000 10/06/21 1030  BP: 119/63 122/65  Pulse: (!) 45 (!) 46  Resp: 20   Temp: (!) 36.1 C (!) 36.4 C  SpO2: 97% 100%    Last Pain:  Vitals:   10/06/21 1030  TempSrc: Temporal  PainSc: 0-No pain                 Arita Miss

## 2021-10-06 NOTE — Anesthesia Preprocedure Evaluation (Signed)
Anesthesia Evaluation  Patient identified by MRN, date of birth, ID band Patient awake    Reviewed: Allergy & Precautions, NPO status , Patient's Chart, lab work & pertinent test results  History of Anesthesia Complications Negative for: history of anesthetic complications  Airway Mallampati: III  TM Distance: >3 FB Neck ROM: Full    Dental no notable dental hx. (+) Teeth Intact   Pulmonary sleep apnea and Continuous Positive Airway Pressure Ventilation , neg COPD, Patient abstained from smoking.Not current smoker, former smoker,    Pulmonary exam normal breath sounds clear to auscultation       Cardiovascular Exercise Tolerance: Good METShypertension, Pt. on medications (-) CAD and (-) Past MI (-) dysrhythmias  Rhythm:Regular Rate:Normal - Systolic murmurs    Neuro/Psych negative neurological ROS  negative psych ROS   GI/Hepatic neg GERD  ,(+)     (-) substance abuse  ,   Endo/Other  diabetes, Well Controlled, Oral Hypoglycemic Agents  Renal/GU negative Renal ROS     Musculoskeletal   Abdominal   Peds  Hematology   Anesthesia Other Findings Past Medical History: No date: Arthritis     Comment:  knee No date: Cancer ([place])     Comment:  skin cancer basal cell shoulder No date: Diabetes mellitus, type 2 ([facility]) No date: Hypertension No date: Sleep apnea     Comment:  CPAP No date: Wears contact lenses  Reproductive/Obstetrics                             Anesthesia Physical Anesthesia Plan  ASA: 2  Anesthesia Plan: General   Post-op Pain Management: Ofirmev IV (intra-op)*   Induction: Intravenous  PONV Risk Score and Plan: 2 and Ondansetron, Dexamethasone and Midazolam  Airway Management Planned: LMA  Additional Equipment: None  Intra-op Plan:   Post-operative Plan: Extubation in OR  Informed Consent: I have reviewed the patients History and Physical, chart, labs and  discussed the procedure including the risks, benefits and alternatives for the proposed anesthesia with the patient or authorized representative who has indicated his/her understanding and acceptance.     Dental advisory given  Plan Discussed with: CRNA and Surgeon  Anesthesia Plan Comments: (Discussed risks of anesthesia with patient, including PONV, sore throat, lip/dental/eye damage. Rare risks discussed as well, such as cardiorespiratory and neurological sequelae, and allergic reactions. Discussed the role of CRNA in patient's perioperative care. Patient understands. Patient has listed allergy to PCN - unknown childhood reaction Severe blistering skin reaction (SJS/TEN)? no Liver or kidney injury caused by PCN? no Hemolytic anemia from PCN? no Drug fever? no Painful swollen joints? no Severe reaction involving inside of mouth, eye, or genital ulcers? no Based on current evidence Alfonse Alpers et al, J Allergy Clin Immunol Pract, 2019), will proceed with cefazolin use: Yes  )        Anesthesia Quick Evaluation

## 2021-10-06 NOTE — Transfer of Care (Signed)
Immediate Anesthesia Transfer of Care Note  Patient: Carlos Hammond  Procedure(s) Performed: RELEASE OF VOLAR PLATE OF RIGHT RING PIP JOINT. (Right: Ring Finger)  Patient Location: PACU  Anesthesia Type:General  Level of Consciousness: drowsy  Airway & Oxygen Therapy: Patient Spontanous Breathing and Patient connected to face mask oxygen  Post-op Assessment: Report given to RN and Post -op Vital signs reviewed and stable  Post vital signs: Reviewed and stable  Last Vitals:  Vitals Value Taken Time  BP    Temp    Pulse    Resp    SpO2      Last Pain:  Vitals:   10/06/21 0722  TempSrc: Oral  PainSc: 6          Complications: No notable events documented.

## 2021-10-07 ENCOUNTER — Encounter: Payer: Self-pay | Admitting: Surgery

## 2021-10-25 ENCOUNTER — Ambulatory Visit: Payer: BC Managed Care – PPO | Attending: Student | Admitting: Occupational Therapy

## 2021-10-25 ENCOUNTER — Encounter: Payer: Self-pay | Admitting: Occupational Therapy

## 2021-10-25 DIAGNOSIS — M79641 Pain in right hand: Secondary | ICD-10-CM | POA: Diagnosis present

## 2021-10-25 DIAGNOSIS — M25641 Stiffness of right hand, not elsewhere classified: Secondary | ICD-10-CM | POA: Diagnosis not present

## 2021-10-25 DIAGNOSIS — L905 Scar conditions and fibrosis of skin: Secondary | ICD-10-CM | POA: Insufficient documentation

## 2021-10-25 NOTE — Therapy (Signed)
[facility] [address], [place], [address] Phone: [phone]   Fax:  [phone]  Occupational Therapy Evaluation  Patient Details  Name: Carlos Hammond MRN: [id number] Date of Birth: [birth year] Referring Provider (OT): Dr Roland Rack   Encounter Date: 10/25/2021   OT End of Session - 10/25/21 2055     Visit Number 1    Number of Visits 5    Date for OT Re-Evaluation 11/29/21    OT Start Time 1433    OT Stop Time 1511    OT Time Calculation (min) 38 min    Activity Tolerance Patient tolerated treatment well    Behavior During Therapy [facility] for tasks assessed/performed             Past Medical History:  Diagnosis Date   Arthritis    knee   Cancer ([place])    skin cancer basal cell shoulder   Diabetes mellitus, type 2 ([place])    Hypertension    Sleep apnea    CPAP   Wears contact lenses     Past Surgical History:  Procedure Laterality Date   CAPSULOTOMY Right 10/06/2021   Procedure: RELEASE OF VOLAR PLATE OF RIGHT RING PIP JOINT.;  Surgeon: Corky Mull, MD;  Location: [facility];  Service: Orthopedics;  Laterality: Right;   ELBOW SURGERY     1990s - Left(x2), right(x1)   KNEE ARTHROSCOPY WITH MEDIAL MENISECTOMY Left 05/09/2017   Procedure: KNEE ARTHROSCOPY WITH PARTIAL  MEDIAL MENISECTOMY CHONDROPLASTY;  Surgeon: Leim Fabry, MD;  Location: [facility];  Service: Orthopedics;  Laterality: Left;  SUPINE WITH LATERAL POST Diabetic - oral meds sleep apnea   SHOULDER ARTHROSCOPY WITH SUBACROMIAL DECOMPRESSION AND BICEP TENDON REPAIR Left 02/15/2019   Procedure: SHOULDER ARTHROSCOPY WITH SUBACROMIAL DECOMPRESSION AND BICEP TENDONODESISs,  Subscapularis Repair, [place] Patch Application;  Surgeon: Leim Fabry, MD;  Location: [facility];  Service: Orthopedics;  Laterality: Left;  Diabetic - oal meds sleep apnea   VASECTOMY      There were no vitals filed for this visit.   Subjective  Assessment - 10/25/21 2049     Subjective  Do not really have pain.  Most of the day on the computer using the mouse with her right hand.  Since my fall in January my finger I could not straighten it.  I still have a small tiny little open area I think    Pertinent History Pt had in Jan 23 dislocation of PIP of R 4th - developed PIP flexor contracture - had release of volar plate on 05/08/72 by Dr Roland Rack at R 4th PIP- pt coming in with static prefab on 4th digit- order for joint jack  and OT to eval and tx    Patient Stated Goals I want to get my finger straight and able to grip again like my pickleball racquet, like to do some woodwork to and fishing    Currently in Pain? Yes    Pain Score 2     Pain Location Finger (Comment which one)    Pain Orientation Right    Pain Descriptors / Indicators Tightness;Tender;Aching    Pain Type Surgical pain    Pain Onset More than a month ago    Pain Frequency Intermittent               [facility] OT Assessment - 10/25/21 0001       Assessment   Medical Diagnosis R 4th PIP volar plate release  Referring Provider (OT) Dr Roland Rack    Onset Date/Surgical Date 10/06/21    Hand Dominance Right    Next MD Visit 4 wks      Prior Function   Vocation Full time employment    Leisure Work on Teaching laboratory technician, likes to [employer], reading, fishing, walking      Right Hand AROM   R Index  MCP 0-90 80 Degrees    R Index PIP 0-100 95 Degrees    R Long  MCP 0-90 85 Degrees    R Long PIP 0-100 95 Degrees   -20   R Ring  MCP 0-90 85 Degrees    R Ring PIP 0-100 70 Degrees   -20   R Ring DIP 0-70 40 Degrees    R Little  MCP 0-90 90 Degrees    R Little PIP 0-100 80 Degrees              Moist heat done prior to review of home program. Patient arrived with prefab metal extension splint on with large Coban. Patient was fitted with a joint jack but then replaced by LMB extension PIP dynamic splint to be used every 2 hours during the day. Had great success with that  in session.  Could tolerated with no increase in pain and swelling. Reviewed with patient also tendon glides with active range of motion with focus on MC flexion with PIP extension. As well as PIP extension on table. Contrast with active range of motion 3 times a day can do contrast and continue with prefab extension splint for this week in between home exercises.  As well as nighttime patient to follow-up in a week to initiate scar massage and monitor progress with home program Patient small open area at the incision.           OT Treatments/Exercises (OP) - 10/25/21 0001       Moist Heat Therapy   Number Minutes Moist Heat 8 Minutes    Moist Heat Location Hand   Prior to review of the splint and PIP motion                   OT Education - 10/25/21 2055     Education Details Findings of evaluation and home program    Person(s) Educated Patient    Methods Explanation;Demonstration;Tactile cues;Verbal cues;Handout    Comprehension Verbal cues required;Returned demonstration;Verbalized understanding                 OT Long Term Goals - 10/25/21 2103       OT LONG TERM GOAL #1   Title Patient to be independent in use of PIP dynamic extension splint to increase PIP extension to 0 degrees while progressing in flexion.    Baseline PIP extension -20.  With a volar Z scar.  PIP flexion 70 degrees, MC 85.    Time 4    Period Weeks    Status New    Target Date 11/22/21      OT LONG TERM GOAL #2   Title Patient to be independent in home program to maintain PIP extension progress at right 4th digit to be able to don gloves and put hand in pocket.    Baseline PIP flexion contracture at the moment -20.  Had flexion contracture since January; patient is 2 1/2 wks.    Time 4    Period Weeks    Status New    Target Date 11/22/21  Plan - 10/25/21 2059     Clinical Impression Statement Patient present at OT evaluation postop right  dominant hand third digit PIP volar plate release.  Done by Dr. Roland Rack on 10/06/21.  Patient show decreased extension of third and 4th PIP -20. Pt with Z scar on volar 3rd PIP .  Patient had in January 23 had dislocation of PIP developing flexor contracture.  Patient with decreased flexion at PIP.  Patient limited in functional use of right dominant hand in ADLs and IADLs.  Order was for a joint jack patient had a better extension stretch with good success using LMB splint for PIP extension.  Able to adjusted with more ease and using it several times during the day for 3 to 5 minutes.  We will monitor for extension improvement at PIP while also gaining active range of motion of flexion.  Patient can benefit from skilled OT services for 1 time a week until follow-up appointment with surgeon.    OT Occupational Profile and History Problem Focused Assessment - Including review of records relating to presenting problem    Occupational performance deficits (Please refer to evaluation for details): ADL's;IADL's;Work;Play;Leisure;Social Participation    Body Structure / Function / Physical Skills ADL;Strength;Dexterity;Pain;UE functional use;IADL;ROM;Scar mobility;Flexibility;FMC;Decreased knowledge of precautions    Rehab Potential Good    Clinical Decision Making Limited treatment options, no task modification necessary    Comorbidities Affecting Occupational Performance: None    Modification or Assistance to Complete Evaluation  No modification of tasks or assist necessary to complete eval    OT Frequency 1x / week    OT Duration 4 weeks    OT Treatment/Interventions Self-care/ADL training;Fluidtherapy;Splinting;Contrast Bath;Therapeutic exercise;Scar mobilization;Passive range of motion;Paraffin;Manual Therapy    Consulted and Agree with Plan of Care Patient             Patient will benefit from skilled therapeutic intervention in order to improve the following deficits and impairments:   Body  Structure / Function / Physical Skills: ADL, Strength, Dexterity, Pain, UE functional use, IADL, ROM, Scar mobility, Flexibility, FMC, Decreased knowledge of precautions       Visit Diagnosis: Stiffness of right hand, not elsewhere classified  Scar condition and fibrosis of skin  Pain in right hand    Problem List There are no problems to display for this patient.   Rosalyn Gess, OTR/L,CLT 10/25/2021, 9:15 PM  [facility] [address], [place], [address] Phone: [phone]   Fax:  [phone]  Name: Carlos Hammond MRN: [id number] Date of Birth: 20-Aug-1955

## 2021-11-01 ENCOUNTER — Ambulatory Visit: Payer: BC Managed Care – PPO | Admitting: Occupational Therapy

## 2021-11-01 DIAGNOSIS — M25641 Stiffness of right hand, not elsewhere classified: Secondary | ICD-10-CM | POA: Diagnosis not present

## 2021-11-01 DIAGNOSIS — M79641 Pain in right hand: Secondary | ICD-10-CM

## 2021-11-01 DIAGNOSIS — L905 Scar conditions and fibrosis of skin: Secondary | ICD-10-CM

## 2021-11-01 NOTE — Therapy (Signed)
[facility] [address], [place], [address] Phone: [phone]   Fax:  [phone]  Occupational Therapy Treatment  Patient Details  Name: Carlos Hammond MRN: [id number] Date of Birth: Jul 28, 1955 Referring Provider (OT): Dr Roland Rack   Encounter Date: 11/01/2021   OT End of Session - 11/01/21 1700     Visit Number 2    Number of Visits 5    Date for OT Re-Evaluation 11/29/21    OT Start Time 1645    OT Stop Time 1725    OT Time Calculation (min) 40 min    Activity Tolerance Patient tolerated treatment well    Behavior During Therapy [facility] for tasks assessed/performed             Past Medical History:  Diagnosis Date   Arthritis    knee   Cancer ([place])    skin cancer basal cell shoulder   Diabetes mellitus, type 2 ([place])    Hypertension    Sleep apnea    CPAP   Wears contact lenses     Past Surgical History:  Procedure Laterality Date   CAPSULOTOMY Right 10/06/2021   Procedure: RELEASE OF VOLAR PLATE OF RIGHT RING PIP JOINT.;  Surgeon: Corky Mull, MD;  Location: [facility];  Service: Orthopedics;  Laterality: Right;   ELBOW SURGERY     1990s - Left(x2), right(x1)   KNEE ARTHROSCOPY WITH MEDIAL MENISECTOMY Left 05/09/2017   Procedure: KNEE ARTHROSCOPY WITH PARTIAL  MEDIAL MENISECTOMY CHONDROPLASTY;  Surgeon: Leim Fabry, MD;  Location: [facility];  Service: Orthopedics;  Laterality: Left;  SUPINE WITH LATERAL POST Diabetic - oral meds sleep apnea   SHOULDER ARTHROSCOPY WITH SUBACROMIAL DECOMPRESSION AND BICEP TENDON REPAIR Left 02/15/2019   Procedure: SHOULDER ARTHROSCOPY WITH SUBACROMIAL DECOMPRESSION AND BICEP TENDONODESISs,  Subscapularis Repair, [employer] Patch Application;  Surgeon: Leim Fabry, MD;  Location: [facility];  Service: Orthopedics;  Laterality: Left;  Diabetic - oal meds sleep apnea   VASECTOMY      There were no vitals filed for this visit.   Subjective  Assessment - 11/01/21 1659     Subjective  My incision is healed now - you told me you'll show me the scar massage- used the splint and did the motion    Pertinent History Pt had in Jan 23 dislocation of PIP of R 4th - developed PIP flexor contracture - had release of volar plate on 09/05/67 by Dr Roland Rack at R 4th PIP- pt coming in with static prefab on 4th digit- order for joint jack  and OT to eval and tx    Patient Stated Goals I want to get my finger straight and able to grip again like my pickleball racquet, like to do some woodwork to and fishing    Currently in Pain? Yes    Pain Score 2     Pain Location Finger (Comment which one)    Pain Orientation Right    Pain Descriptors / Indicators Tightness;Tender    Pain Type Surgical pain    Pain Onset More than a month ago    Pain Frequency Intermittent                 Patient arrived with incision healed.  Ready to be educated in scar massage.  Scar thick and adhere and putting patient into extension.  Patient still -24 extension lag using LMB splint as well as prefab PIP extension splint.  OT Treatments/Exercises (OP) - 11/01/21 0001       RUE Paraffin   Number Minutes Paraffin 8 Minutes    RUE Paraffin Location Hand    Comments LMB splint on prior to scar massage and splint fitting      RUE Fluidotherapy   Number Minutes Fluidotherapy --    RUE Fluidotherapy Location --    Comments --              Patient educated in scar massage as well as silicone sleeve use on and off 2 hours at a time during the day when on the computer.  Patient was fitted with a joint jack at evaluation but then replaced by LMB extension PIP dynamic splint to be used every 2 hours during the day. At this time patient extension lag with scar tissue is about the same. At this time after using LMB extension splint during paraffin with soft tissue massage able to fabricate patient a PIP extension quick cast to wear at nighttime as well  as on and off 2 hours at a time during the day.  Reviewed with patient also tendon glides with active range of motion with focus on MC flexion with PIP extension. As well as PIP extension on table. Contrast to be done prior to HEP - 2-3 x day        OT Education - 11/01/21 1659     Education Details progress and HEP - splint use    Person(s) Educated Patient    Methods Explanation;Demonstration;Tactile cues;Verbal cues;Handout    Comprehension Verbal cues required;Returned demonstration;Verbalized understanding                 OT Long Term Goals - 10/25/21 2103       OT LONG TERM GOAL #1   Title Patient to be independent in use of PIP dynamic extension splint to increase PIP extension to 0 degrees while progressing in flexion.    Baseline PIP extension -20.  With a volar Z scar.  PIP flexion 70 degrees, MC 85.    Time 4    Period Weeks    Status New    Target Date 11/22/21      OT LONG TERM GOAL #2   Title Patient to be independent in home program to maintain PIP extension progress at right 4th digit to be able to don gloves and put hand in pocket.    Baseline PIP flexion contracture at the moment -20.  Had flexion contracture since January; patient is 2 1/2 wks.    Time 4    Period Weeks    Status New    Target Date 11/22/21                   Plan - 11/01/21 1700     Clinical Impression Statement Patient present at OT evaluation postop right dominant hand third digit PIP volar plate release.  Done by Dr. Roland Rack on 10/06/21.  Patient show decreased extension of third and 4th PIP -20. Pt with Z scar on volar 3rd PIP .  Patient had in January 23 had dislocation of PIP developing flexor contracture.  Patient with decreased flexion at PIP.  Patient limited in functional use of right dominant hand in ADLs and IADLs.  Order was for a joint jack patient had a better extension stretch with good success using LMB splint for PIP extension.  Able to adjusted with more ease  and using it several times during the day for  3 to 5 minutes.  Patient arrived this date with incision healed and able to be educated on scar massage.  Patient to show PIP extension lag still about 20 degrees.  Fabricated patient a PIP extension quick cast to wear at nighttime as well as on and off during the day 2 hours at a time taking tenths with a silicone sleeve for scar management as well as edema.  We will monitor for extension improvement at PIP while also gaining active range of motion of flexion.  Patient can benefit from skilled OT services for 1 time a week until follow-up appointment with surgeon.    OT Occupational Profile and History Problem Focused Assessment - Including review of records relating to presenting problem    Occupational performance deficits (Please refer to evaluation for details): ADL's;IADL's;Work;Play;Leisure;Social Participation    Body Structure / Function / Physical Skills ADL;Strength;Dexterity;Pain;UE functional use;IADL;ROM;Scar mobility;Flexibility;FMC;Decreased knowledge of precautions    Rehab Potential Good    Clinical Decision Making Limited treatment options, no task modification necessary    Comorbidities Affecting Occupational Performance: None    Modification or Assistance to Complete Evaluation  No modification of tasks or assist necessary to complete eval    OT Frequency 1x / week    OT Duration 4 weeks    OT Treatment/Interventions Self-care/ADL training;Fluidtherapy;Splinting;Contrast Bath;Therapeutic exercise;Scar mobilization;Passive range of motion;Paraffin;Manual Therapy    Consulted and Agree with Plan of Care Patient             Patient will benefit from skilled therapeutic intervention in order to improve the following deficits and impairments:   Body Structure / Function / Physical Skills: ADL, Strength, Dexterity, Pain, UE functional use, IADL, ROM, Scar mobility, Flexibility, FMC, Decreased knowledge of precautions       Visit  Diagnosis: Stiffness of right hand, not elsewhere classified  Scar condition and fibrosis of skin  Pain in right hand    Problem List There are no problems to display for this patient.   Rosalyn Gess, OTR/L,CLT 11/01/2021, 9:13 PM  [place] [address], [place], [address] Phone: [phone]   Fax:  [phone]  Name: Carlos Hammond MRN: [id number] Date of Birth: 08/02/1955

## 2021-11-09 ENCOUNTER — Ambulatory Visit: Payer: BC Managed Care – PPO | Attending: Student | Admitting: Occupational Therapy

## 2021-11-09 DIAGNOSIS — M25641 Stiffness of right hand, not elsewhere classified: Secondary | ICD-10-CM | POA: Diagnosis present

## 2021-11-09 DIAGNOSIS — M79641 Pain in right hand: Secondary | ICD-10-CM | POA: Diagnosis present

## 2021-11-09 DIAGNOSIS — L905 Scar conditions and fibrosis of skin: Secondary | ICD-10-CM | POA: Diagnosis present

## 2021-11-09 NOTE — Therapy (Signed)
[place] [facility] [address] [place], [address] Phone: [phone]   Fax:  [phone]  Occupational Therapy Treatment  Patient Details  Name: Efraim Vanallen MRN: [id number] Date of Birth: [birth year] Referring Provider (OT): Dr Roland Rack   Encounter Date: 11/09/2021   OT End of Session - 11/09/21 1656     Visit Number 3    Number of Visits 5    Date for OT Re-Evaluation 11/29/21    OT Start Time 1657    OT Stop Time 1730    OT Time Calculation (min) 33 min    Activity Tolerance Patient tolerated treatment well    Behavior During Therapy [facility] for tasks assessed/performed             Past Medical History:  Diagnosis Date   Arthritis    knee   Cancer ([place])    skin cancer basal cell shoulder   Diabetes mellitus, type 2 ([place])    Hypertension    Sleep apnea    CPAP   Wears contact lenses     Past Surgical History:  Procedure Laterality Date   CAPSULOTOMY Right 10/06/2021   Procedure: RELEASE OF VOLAR PLATE OF RIGHT RING PIP JOINT.;  Surgeon: Corky Mull, MD;  Location: [facility];  Service: Orthopedics;  Laterality: Right;   ELBOW SURGERY     1990s - Left(x2), right(x1)   KNEE ARTHROSCOPY WITH MEDIAL MENISECTOMY Left 05/09/2017   Procedure: KNEE ARTHROSCOPY WITH PARTIAL  MEDIAL MENISECTOMY CHONDROPLASTY;  Surgeon: Leim Fabry, MD;  Location: [facility];  Service: Orthopedics;  Laterality: Left;  SUPINE WITH LATERAL POST Diabetic - oral meds sleep apnea   SHOULDER ARTHROSCOPY WITH SUBACROMIAL DECOMPRESSION AND BICEP TENDON REPAIR Left 02/15/2019   Procedure: SHOULDER ARTHROSCOPY WITH SUBACROMIAL DECOMPRESSION AND BICEP TENDONODESISs,  Subscapularis Repair, [employer] Patch Application;  Surgeon: Leim Fabry, MD;  Location: [facility];  Service: Orthopedics;  Laterality: Left;  Diabetic - oal meds sleep apnea   VASECTOMY      There were no vitals filed for this visit.   Subjective  Assessment - 11/09/21 1656     Subjective  Doing better - like the compression sleeve and trying to do my exercises - can maintain while out of cast for 2 hrs -    Pertinent History Pt had in Jan 23 dislocation of PIP of R 4th - developed PIP flexor contracture - had release of volar plate on 08/13/60 by Dr Roland Rack at R 4th PIP- pt coming in with static prefab on 4th digit- order for joint jack  and OT to eval and tx    Patient Stated Goals I want to get my finger straight and able to grip again like my pickleball racquet, like to do some woodwork to and fishing    Currently in Pain? No/denies                [facility] OT Assessment - 11/09/21 0001       Right Hand AROM   R Ring  MCP 0-90 90 Degrees    R Ring PIP 0-100 85 Degrees   -20   R Ring DIP 0-70 60 Degrees               great improvement in flexion while maintaining extention PIP -20         OT Treatments/Exercises (OP) - 11/09/21 0001       RUE Paraffin   Number Minutes Paraffin 8 Minutes  RUE Paraffin Location Hand    Comments LMBsplint for PIP extnetion prior to scar massage                Patient educated in scar massage as well as silicone sleeve use - can increase to 3 hrs on and cast 2 hrs on -- then few days later 4 days silicon sleeve and cast on 2 hrs- cont cast night time    Patient was fitted with a joint jack at evaluation but then replaced by LMB extension PIP dynamic splint to be used every 2 hours during the day. Last visit patient's extension lag with scar tissue is about the same. Last week after  LMB extension splint during paraffin with soft tissue massage did fabricate patient a PIP extension quick cast    Reviewed with patient also tendon glides with active range of motion with focus on MC flexion with PIP extension. As well as PIP extension on table Cont with same HEP . Contrast  or heat to be done prior to HEP - 2-3 x day       OT Education - 11/09/21 1656     Education  Details progress and HEP - splint use    Person(s) Educated Patient    Methods Explanation;Demonstration;Tactile cues;Verbal cues;Handout    Comprehension Verbal cues required;Returned demonstration;Verbalized understanding                 OT Long Term Goals - 10/25/21 2103       OT LONG TERM GOAL #1   Title Patient to be independent in use of PIP dynamic extension splint to increase PIP extension to 0 degrees while progressing in flexion.    Baseline PIP extension -20.  With a volar Z scar.  PIP flexion 70 degrees, MC 85.    Time 4    Period Weeks    Status New    Target Date 11/22/21      OT LONG TERM GOAL #2   Title Patient to be independent in home program to maintain PIP extension progress at right 4th digit to be able to don gloves and put hand in pocket.    Baseline PIP flexion contracture at the moment -20.  Had flexion contracture since January; patient is 2 1/2 wks.    Time 4    Period Weeks    Status New    Target Date 11/22/21                   Plan - 11/09/21 1657     Clinical Impression Statement Patient present at OT evaluation postop right dominant hand third digit PIP volar plate release.  Done by Dr. Roland Rack on 10/06/21.  Patient show decreased extension of third and 4th PIP -20. Pt with Z scar on volar 3rd PIP .  Patient had in January 23 had dislocation of PIP developing flexor contracture.  Patient with decreased flexion at PIP.  Patient limited in functional use of right dominant hand in ADLs and IADLs.  Order was for a joint jack patient had a better extension stretch with good success using LMB splint for PIP extension.  Able to adjusted with more ease and using it several times during the day for 3 to 5 minutes.  Patient able to do scar massage since last week and still had PIP extension lag of- 20 degrees.  Pt able to maintain extention with PIP extension quick cast to wear at nighttime as well as during the day 2  hours and off with silicon sleeve-  pt can now increase time out of cast gradually until next week -3hrs off and then 4 hrs off - will reassess if can take away night time cast prior to appt with Dr Roland Rack next Friday.  Will monitor progress and maintainace of extension while gaining active range of motion of flexion at PIP.  Patient can benefit from skilled OT services for 1 time a week until follow-up appointment with surgeon.    OT Occupational Profile and History Problem Focused Assessment - Including review of records relating to presenting problem    Occupational performance deficits (Please refer to evaluation for details): ADL's;IADL's;Work;Play;Leisure;Social Participation    Body Structure / Function / Physical Skills ADL;Strength;Dexterity;Pain;UE functional use;IADL;ROM;Scar mobility;Flexibility;FMC;Decreased knowledge of precautions    Rehab Potential Good    Clinical Decision Making Limited treatment options, no task modification necessary    Comorbidities Affecting Occupational Performance: None    Modification or Assistance to Complete Evaluation  No modification of tasks or assist necessary to complete eval    OT Frequency 1x / week    OT Duration 4 weeks    OT Treatment/Interventions Self-care/ADL training;Fluidtherapy;Splinting;Contrast Bath;Therapeutic exercise;Scar mobilization;Passive range of motion;Paraffin;Manual Therapy    Consulted and Agree with Plan of Care Patient             Patient will benefit from skilled therapeutic intervention in order to improve the following deficits and impairments:   Body Structure / Function / Physical Skills: ADL, Strength, Dexterity, Pain, UE functional use, IADL, ROM, Scar mobility, Flexibility, FMC, Decreased knowledge of precautions       Visit Diagnosis: Stiffness of right hand, not elsewhere classified  Scar condition and fibrosis of skin  Pain in right hand    Problem List There are no problems to display for this patient.   Rosalyn Gess,  OTR/L,CLT 11/09/2021, 5:38 PM  [facility] [address], [place], [address] Phone: [phone]   Fax:  [phone]  Name: Lora Glomski MRN: [id number] Date of Birth: 10/26/1955

## 2021-11-16 ENCOUNTER — Ambulatory Visit: Payer: BC Managed Care – PPO | Admitting: Occupational Therapy

## 2021-11-16 DIAGNOSIS — M25641 Stiffness of right hand, not elsewhere classified: Secondary | ICD-10-CM | POA: Diagnosis not present

## 2021-11-16 DIAGNOSIS — L905 Scar conditions and fibrosis of skin: Secondary | ICD-10-CM

## 2021-11-16 DIAGNOSIS — M79641 Pain in right hand: Secondary | ICD-10-CM

## 2021-11-16 NOTE — Therapy (Signed)
[facility] [address] [place], [address] Phone: [phone]   Fax:  [phone]  Occupational Therapy Treatment  Patient Details  Name: Carlos Hammond MRN: [id number] Date of Birth: [birth year] Referring Provider (OT): Dr Roland Rack   Encounter Date: 11/16/2021   OT End of Session - 11/16/21 1818     Visit Number 4    Number of Visits 5    Date for OT Re-Evaluation 11/29/21    OT Start Time 1701    OT Stop Time 1745    OT Time Calculation (min) 44 min    Activity Tolerance Patient tolerated treatment well    Behavior During Therapy [facility] for tasks assessed/performed             Past Medical History:  Diagnosis Date   Arthritis    knee   Cancer ([place])    skin cancer basal cell shoulder   Diabetes mellitus, type 2 ([place])    Hypertension    Sleep apnea    CPAP   Wears contact lenses     Past Surgical History:  Procedure Laterality Date   CAPSULOTOMY Right 10/06/2021   Procedure: RELEASE OF VOLAR PLATE OF RIGHT RING PIP JOINT.;  Surgeon: Corky Mull, MD;  Location: [facility];  Service: Orthopedics;  Laterality: Right;   ELBOW SURGERY     1990s - Left(x2), right(x1)   KNEE ARTHROSCOPY WITH MEDIAL MENISECTOMY Left 05/09/2017   Procedure: KNEE ARTHROSCOPY WITH PARTIAL  MEDIAL MENISECTOMY CHONDROPLASTY;  Surgeon: Leim Fabry, MD;  Location: [place];  Service: Orthopedics;  Laterality: Left;  SUPINE WITH LATERAL POST Diabetic - oral meds sleep apnea   SHOULDER ARTHROSCOPY WITH SUBACROMIAL DECOMPRESSION AND BICEP TENDON REPAIR Left 02/15/2019   Procedure: SHOULDER ARTHROSCOPY WITH SUBACROMIAL DECOMPRESSION AND BICEP TENDONODESISs,  Subscapularis Repair, [place] Patch Application;  Surgeon: Leim Fabry, MD;  Location: [place];  Service: Orthopedics;  Laterality: Left;  Diabetic - oal meds sleep apnea   VASECTOMY      There were no vitals filed for this visit.   Subjective  Assessment - 11/16/21 1816     Subjective  Doing much better I am so happy with my finger.  I am weaning out of my cast during the day.  Still sleeping with it.  Finger staying more straight.    Pertinent History Pt had in Jan 23 dislocation of PIP of R 4th - developed PIP flexor contracture - had release of volar plate on 04/12/69 by Dr Roland Rack at R 4th PIP- pt coming in with static prefab on 4th digit- order for joint jack  and OT to eval and tx    Patient Stated Goals I want to get my finger straight and able to grip again like my pickleball racquet, like to do some woodwork to and fishing    Currently in Pain? Yes    Pain Score 1     Pain Location Finger (Comment which one)    Pain Orientation Right    Pain Descriptors / Indicators Tightness    Pain Type Surgical pain    Pain Onset More than a month ago    Pain Frequency Occasional                [facility] OT Assessment - 11/16/21 0001       Strength   Right Hand Grip (lbs) 55    Right Hand Lateral Pinch 24 lbs    Right Hand 3 Point  Pinch 21 lbs    Left Hand Grip (lbs) 75    Left Hand Lateral Pinch 20 lbs    Left Hand 3 Point Pinch 20 lbs      Right Hand AROM   R Long  MCP 0-90 90 Degrees    R Long PIP 0-100 95 Degrees    R Ring  MCP 0-90 90 Degrees    R Ring PIP 0-100 90 Degrees   -20   R Ring DIP 0-70 70 Degrees                Patient continues to make great progress every visit.  And gaining flexion as well as maintaining improving extension. Scar tissue improving as well as grip and prehension assist this date.  Within functional limits for his age -will increase as composite fist increase.        OT Treatments/Exercises (OP) - 11/16/21 0001       RUE Paraffin   Number Minutes Paraffin 8 Minutes    RUE Paraffin Location Hand    Comments LMB splint on PIP prior to ROM and soft tissue              Patient to continue scar massage as well as silicone sleeve use during the day  Patient was fitted with  a joint jack at evaluation but then replaced by LMB extension PIP dynamic splint to be used every 2 hours during the day but then was replaced by a PIP extension quick cast Patient making great progress in flexion while maintaining extension. Patient weaning out of the daytime extension splint now -nighttime 1 to be kept on for r another 2 weeks recommended. . Is able to gain about 5 degrees of extension after soft tissue massage and stretches able to get to -15 but patient is PIP joint is really tight after attempts of joint mopes.   Reviewed with patient  tendon glides with active range of motion with focus on MC flexion with PIP extension. As well as PIP extension on table Cont with same HEP . Add some strengthening for PIP extension for patient to be able to maintain extension . Patient will follow-up with me in          OT Education - 11/16/21 1817     Education Details progress and HEP - splint use    Person(s) Educated Patient    Methods Explanation;Demonstration;Tactile cues;Verbal cues;Handout    Comprehension Verbal cues required;Returned demonstration;Verbalized understanding                 OT Long Term Goals - 10/25/21 2103       OT LONG TERM GOAL #1   Title Patient to be independent in use of PIP dynamic extension splint to increase PIP extension to 0 degrees while progressing in flexion.    Baseline PIP extension -20.  With a volar Z scar.  PIP flexion 70 degrees, MC 85.    Time 4    Period Weeks    Status New    Target Date 11/22/21      OT LONG TERM GOAL #2   Title Patient to be independent in home program to maintain PIP extension progress at right 4th digit to be able to don gloves and put hand in pocket.    Baseline PIP flexion contracture at the moment -20.  Had flexion contracture since January; patient is 2 1/2 wks.    Time 4    Period Weeks  Status New    Target Date 11/22/21                   Plan - 11/16/21 1818      Clinical Impression Statement Patient present at OT evaluation postop right dominant hand third digit PIP volar plate release.  Done by Dr. Roland Rack on 10/06/21.  Patient show decreased extension of third and 4th PIP -20. Pt with Z scar on volar 3rd PIP .  Patient had in January 23 a dislocation of PIP developing flexor contracture.  Patient with decreased flexion at PIP.  Patient limited in functional use of right dominant hand in ADLs and IADLs.  Order was for a joint jack  but pt did better with LMB splint and quick cast for PIP extention. Pt doing very well with HEP and use of quick cast - had pt start weaning out of it during day if able to maintain PIP extention and gaining flexion. Great progress in flexion -and maintain -20 at PIP. Pt to cont to wear quick cast night time for another 2 wks and will discharge at that time. Will monitor progress and maintainace of extension while gaining active range of motion of flexion at PIP. Grip strength WFL for age. Prehension WNL.  Patient can benefit from skilled OT services for about 2 more visit over the next 6 wks. .    OT Occupational Profile and History Problem Focused Assessment - Including review of records relating to presenting problem    Occupational performance deficits (Please refer to evaluation for details): ADL's;IADL's;Work;Play;Leisure;Social Participation    Body Structure / Function / Physical Skills ADL;Strength;Dexterity;Pain;UE functional use;IADL;ROM;Scar mobility;Flexibility;FMC;Decreased knowledge of precautions    Rehab Potential Good    Clinical Decision Making Limited treatment options, no task modification necessary    Comorbidities Affecting Occupational Performance: None    Modification or Assistance to Complete Evaluation  No modification of tasks or assist necessary to complete eval    OT Frequency Biweekly    OT Duration 6 weeks    OT Treatment/Interventions Self-care/ADL training;Fluidtherapy;Splinting;Contrast Bath;Therapeutic  exercise;Scar mobilization;Passive range of motion;Paraffin;Manual Therapy    Consulted and Agree with Plan of Care Patient             Patient will benefit from skilled therapeutic intervention in order to improve the following deficits and impairments:   Body Structure / Function / Physical Skills: ADL, Strength, Dexterity, Pain, UE functional use, IADL, ROM, Scar mobility, Flexibility, FMC, Decreased knowledge of precautions       Visit Diagnosis: Stiffness of right hand, not elsewhere classified  Scar condition and fibrosis of skin  Pain in right hand    Problem List There are no problems to display for this patient.   Rosalyn Gess, OTR/L,CLT 11/16/2021, 6:27 PM  [facility] [address] [place], [address] Phone: [phone]   Fax:  [phone]  Name: Carlos Hammond MRN: [id number] Date of Birth: 1956/01/14

## 2021-11-22 ENCOUNTER — Encounter: Payer: Self-pay | Admitting: Occupational Therapy

## 2021-12-01 ENCOUNTER — Ambulatory Visit: Payer: BC Managed Care – PPO | Admitting: Occupational Therapy

## 2022-11-04 ENCOUNTER — Ambulatory Visit: Payer: Medicare HMO

## 2022-11-04 DIAGNOSIS — Z86010 Personal history of colonic polyps: Secondary | ICD-10-CM | POA: Diagnosis not present

## 2022-11-04 DIAGNOSIS — Z09 Encounter for follow-up examination after completed treatment for conditions other than malignant neoplasm: Secondary | ICD-10-CM | POA: Diagnosis present
